# Patient Record
Sex: Female | Born: 1965 | Race: White | Hispanic: No | Smoking: Former smoker
Health system: Southern US, Community
[De-identification: ages and names within clinical notes are randomized; demographics above are authoritative.]

## PROBLEM LIST (undated history)

## (undated) DIAGNOSIS — B977 Papillomavirus as the cause of diseases classified elsewhere: Secondary | ICD-10-CM

## (undated) DIAGNOSIS — IMO0002 Reserved for concepts with insufficient information to code with codable children: Secondary | ICD-10-CM

## (undated) DIAGNOSIS — IMO0001 Reserved for inherently not codable concepts without codable children: Secondary | ICD-10-CM

## (undated) DIAGNOSIS — R12 Heartburn: Secondary | ICD-10-CM

## (undated) DIAGNOSIS — R896 Abnormal cytological findings in specimens from other organs, systems and tissues: Secondary | ICD-10-CM

## (undated) DIAGNOSIS — N200 Calculus of kidney: Secondary | ICD-10-CM

## (undated) HISTORY — DX: Calculus of kidney: N20.0

## (undated) HISTORY — DX: Heartburn: R12

## (undated) HISTORY — DX: Papillomavirus as the cause of diseases classified elsewhere: B97.7

## (undated) HISTORY — DX: Reserved for inherently not codable concepts without codable children: IMO0001

## (undated) HISTORY — PX: MOUTH SURGERY: SHX715

## (undated) HISTORY — DX: Reserved for concepts with insufficient information to code with codable children: IMO0002

## (undated) HISTORY — PX: KIDNEY SURGERY: SHX687

## (undated) HISTORY — PX: GYNECOLOGIC CRYOSURGERY: SHX857

## (undated) HISTORY — PX: GANGLION CYST EXCISION: SHX1691

## (undated) HISTORY — DX: Abnormal cytological findings in specimens from other organs, systems and tissues: R89.6

## (undated) HISTORY — PX: KNEE SURGERY: SHX244

---

## 1997-07-26 ENCOUNTER — Encounter: Admission: RE | Admit: 1997-07-26 | Discharge: 1997-10-24 | Payer: Self-pay | Admitting: Gynecology

## 1997-08-17 ENCOUNTER — Inpatient Hospital Stay (HOSPITAL_COMMUNITY): Admission: AD | Admit: 1997-08-17 | Discharge: 1997-08-17 | Payer: Self-pay | Admitting: Gynecology

## 1997-10-01 ENCOUNTER — Inpatient Hospital Stay (HOSPITAL_COMMUNITY): Admission: AD | Admit: 1997-10-01 | Discharge: 1997-10-04 | Payer: Self-pay | Admitting: Obstetrics and Gynecology

## 1997-11-01 ENCOUNTER — Encounter (HOSPITAL_COMMUNITY): Admission: RE | Admit: 1997-11-01 | Discharge: 1997-12-05 | Payer: Self-pay | Admitting: Gynecology

## 1998-11-19 ENCOUNTER — Other Ambulatory Visit: Admission: RE | Admit: 1998-11-19 | Discharge: 1998-11-19 | Payer: Self-pay | Admitting: Gynecology

## 1999-11-20 ENCOUNTER — Other Ambulatory Visit: Admission: RE | Admit: 1999-11-20 | Discharge: 1999-11-20 | Payer: Self-pay | Admitting: Gynecology

## 2000-12-18 ENCOUNTER — Other Ambulatory Visit: Admission: RE | Admit: 2000-12-18 | Discharge: 2000-12-18 | Payer: Self-pay | Admitting: Gynecology

## 2001-06-10 ENCOUNTER — Other Ambulatory Visit: Admission: RE | Admit: 2001-06-10 | Discharge: 2001-06-10 | Payer: Self-pay | Admitting: Gynecology

## 2001-12-31 ENCOUNTER — Other Ambulatory Visit: Admission: RE | Admit: 2001-12-31 | Discharge: 2001-12-31 | Payer: Self-pay | Admitting: Gynecology

## 2002-11-17 ENCOUNTER — Ambulatory Visit (HOSPITAL_BASED_OUTPATIENT_CLINIC_OR_DEPARTMENT_OTHER): Admission: RE | Admit: 2002-11-17 | Discharge: 2002-11-17 | Payer: Self-pay | Admitting: Orthopedic Surgery

## 2003-01-10 ENCOUNTER — Other Ambulatory Visit: Admission: RE | Admit: 2003-01-10 | Discharge: 2003-01-10 | Payer: Self-pay | Admitting: Gynecology

## 2004-01-12 ENCOUNTER — Other Ambulatory Visit: Admission: RE | Admit: 2004-01-12 | Discharge: 2004-01-12 | Payer: Self-pay | Admitting: Gynecology

## 2005-01-20 ENCOUNTER — Other Ambulatory Visit: Admission: RE | Admit: 2005-01-20 | Discharge: 2005-01-20 | Payer: Self-pay | Admitting: Gynecology

## 2005-04-30 ENCOUNTER — Emergency Department (HOSPITAL_COMMUNITY): Admission: EM | Admit: 2005-04-30 | Discharge: 2005-05-01 | Payer: Self-pay | Admitting: Emergency Medicine

## 2005-06-05 ENCOUNTER — Encounter: Admission: RE | Admit: 2005-06-05 | Discharge: 2005-06-05 | Payer: Self-pay | Admitting: Gastroenterology

## 2006-01-22 ENCOUNTER — Other Ambulatory Visit: Admission: RE | Admit: 2006-01-22 | Discharge: 2006-01-22 | Payer: Self-pay | Admitting: Gynecology

## 2006-09-23 ENCOUNTER — Encounter: Admission: RE | Admit: 2006-09-23 | Discharge: 2006-09-23 | Payer: Self-pay | Admitting: Family Medicine

## 2007-02-18 ENCOUNTER — Other Ambulatory Visit: Admission: RE | Admit: 2007-02-18 | Discharge: 2007-02-18 | Payer: Self-pay | Admitting: Gynecology

## 2007-07-28 ENCOUNTER — Other Ambulatory Visit: Admission: RE | Admit: 2007-07-28 | Discharge: 2007-07-28 | Payer: Self-pay | Admitting: Gynecology

## 2007-09-09 IMAGING — US US TRANSVAGINAL NON-OB
1 series · 14 of 25 positions shown · non-contrast
Comparison: none

CLINICAL DATA: Severe abdominal pain.  
 TRANSABDOMINAL AND TRANSVAGINAL PELVIC ULTRASOUND:
TECHNIQUE: Both transabdominal and transvaginal ultrasound examinations of the pelvis were performed including evaluation of the uterus, ovaries, adnexal regions, and pelvic cul-de-sac.

[Series 1: unknown · 0.26mm/px · 14 of 43 slices shown]
[im 1/43]
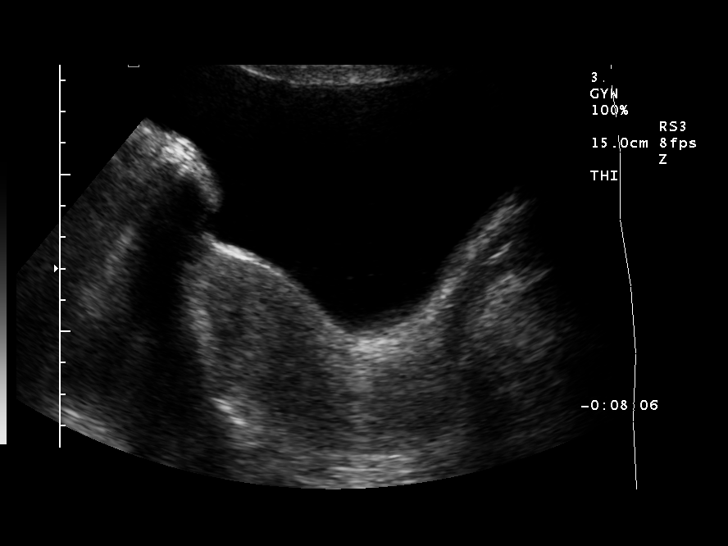
[im 4/43]
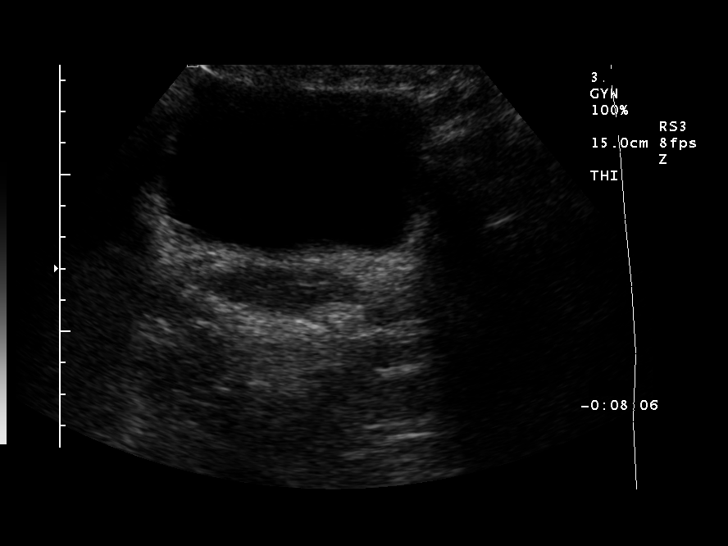
[im 8/43]
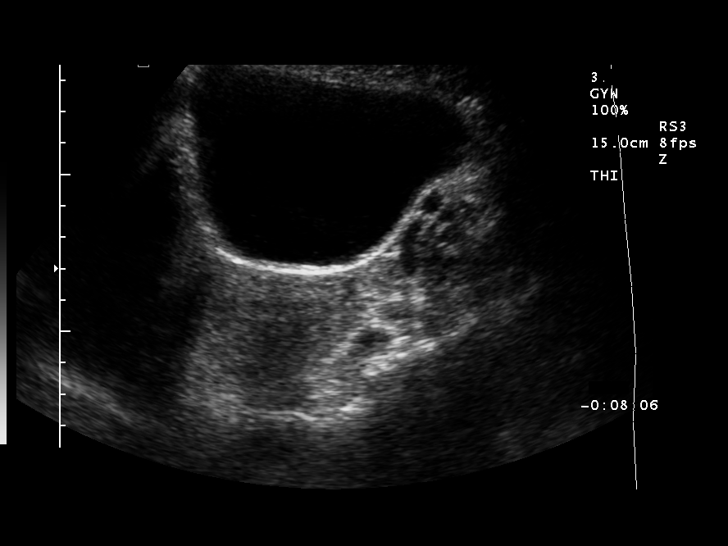
[im 11/43]
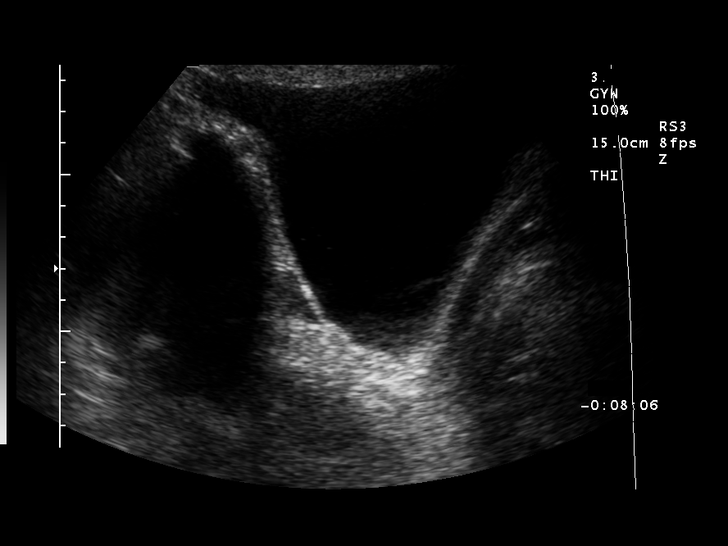
[im 15/43]
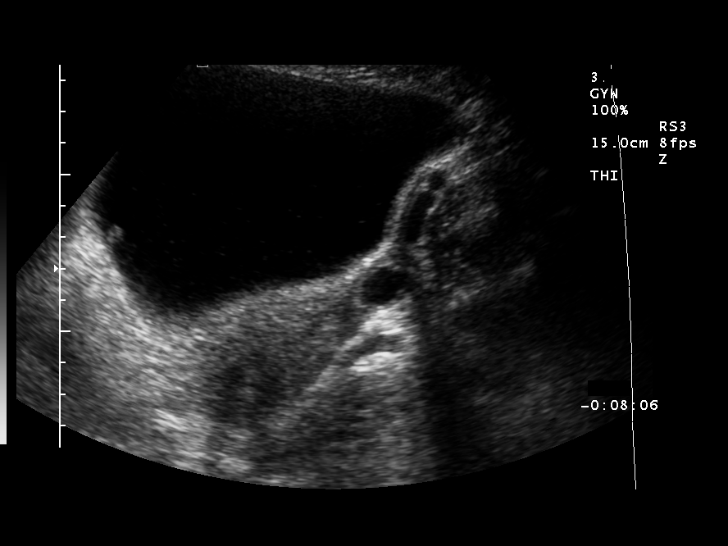
[im 16/43]
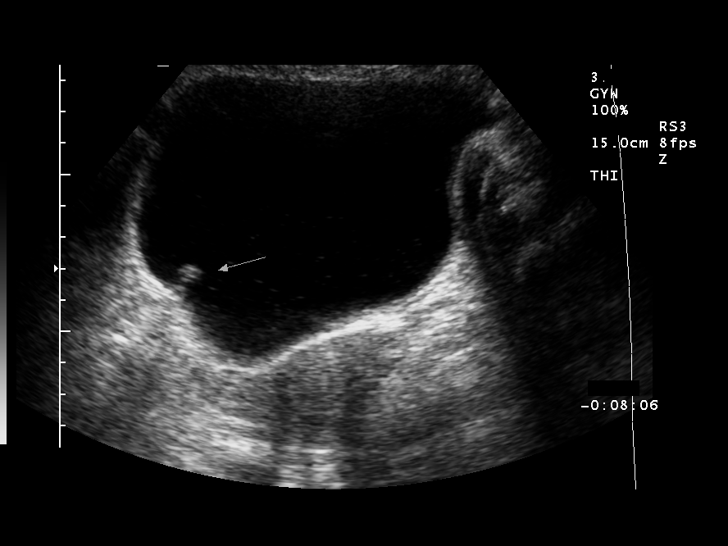
[im 20/43]
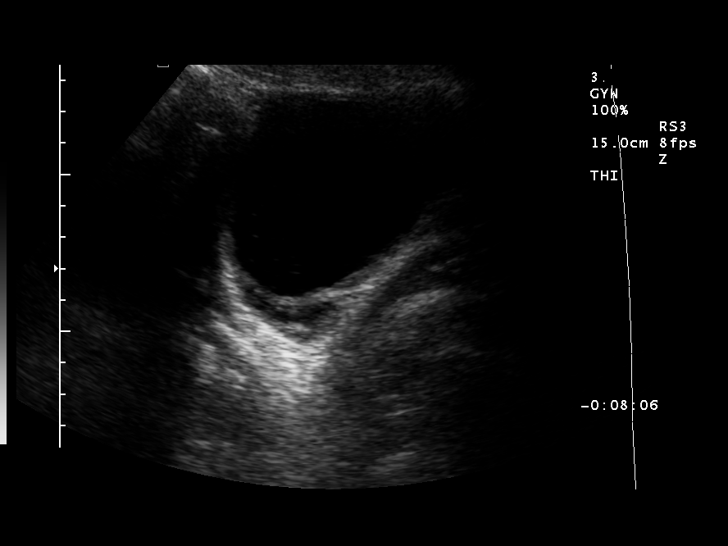
[im 23/43]
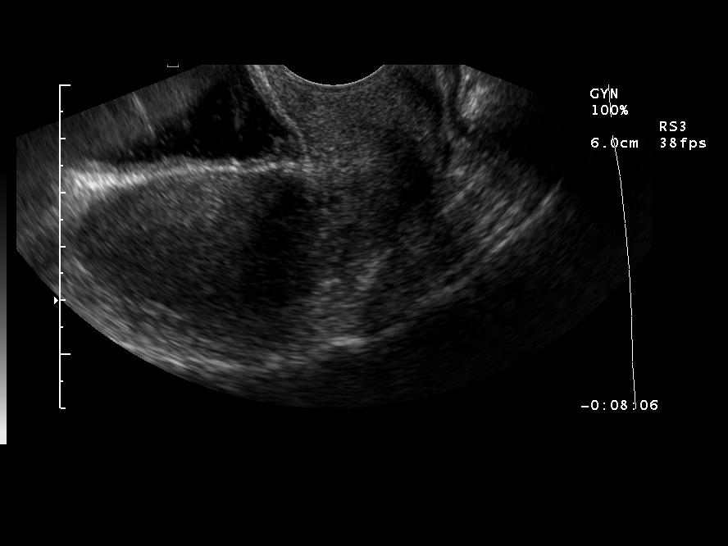
[im 27/43]
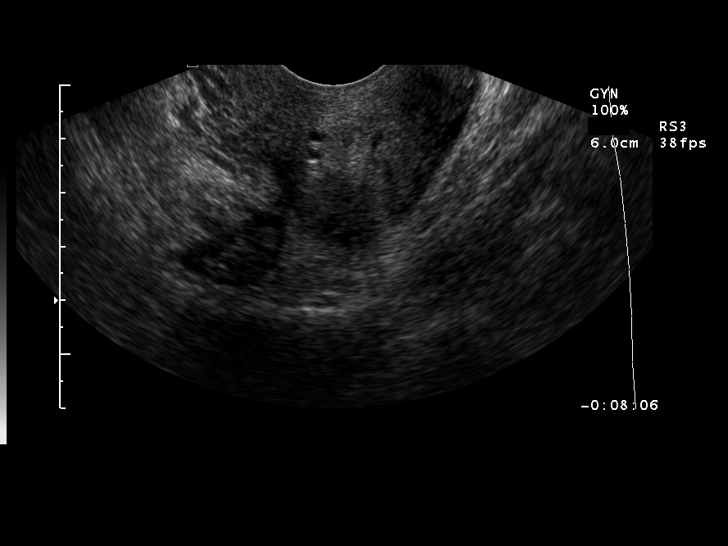
[im 29/43]
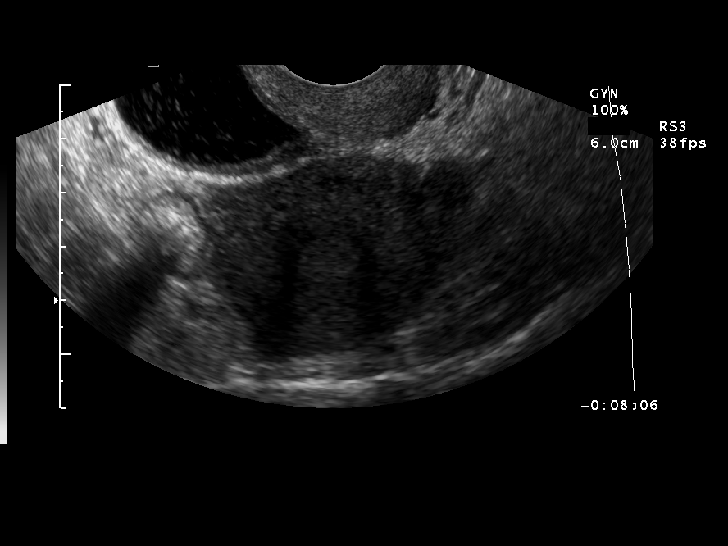
[im 32/43]
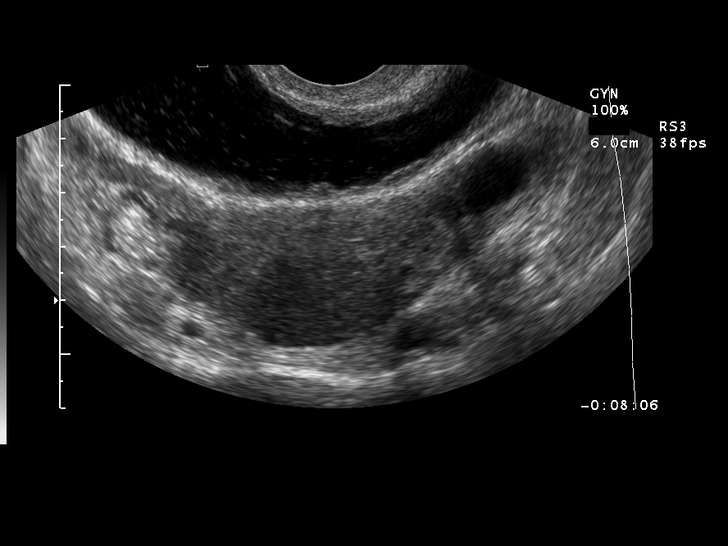
[im 36/43]
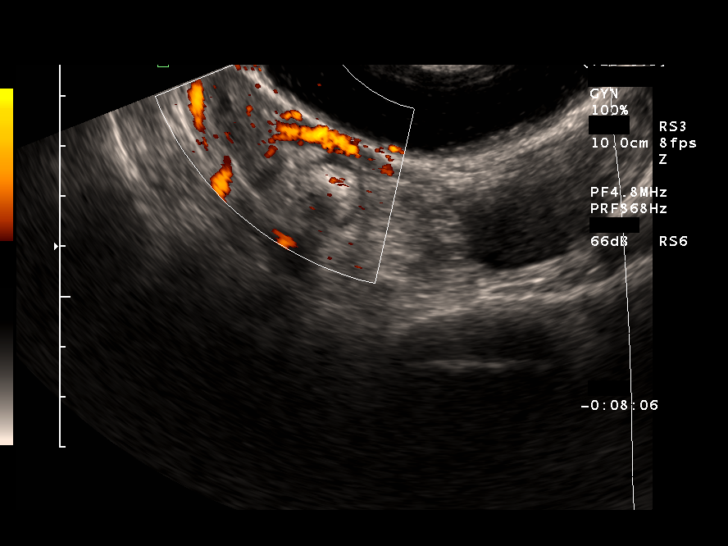
[im 39/43]
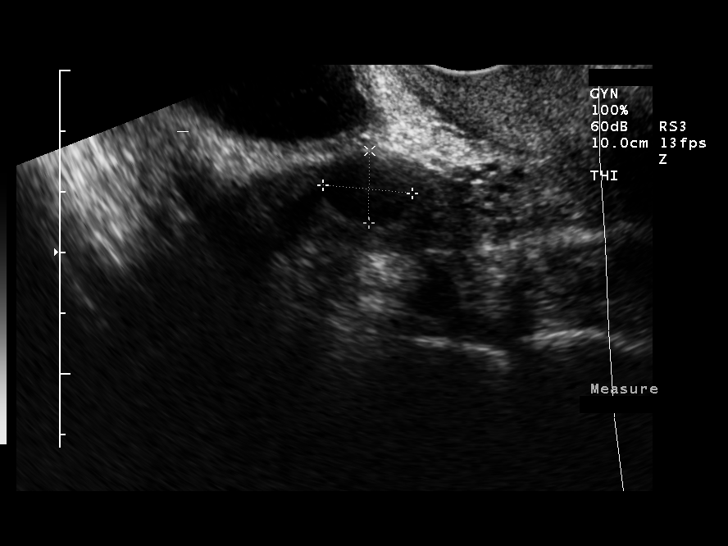
[im 43/43]
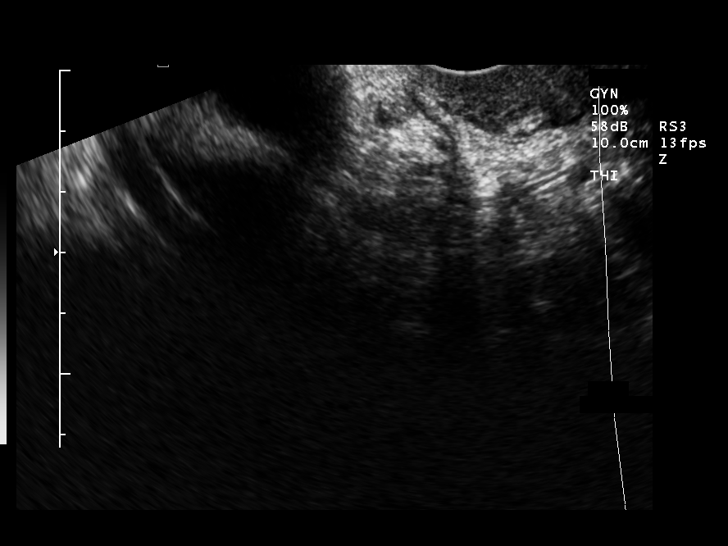

[14 of 25 positions shown; findings below may reference images not displayed]

FINDINGS: The uterus measures 7.6 x 5.5 x 3.4 cm.  Endometrial stripe measures 0.8 cm.  Right ovary measures 4.1 x 1.6 x 1.7 cm and the left ovary measures 3.5 x 1.6 x 1.9 cm.  Ovaries appear normal with a small follicle identified on the left.  No free pelvic fluid.  Limited visualization of the bladder demonstrates a small echogenic focus posteriorly.  Patient may have history of prior ureteral reimplantation.
IMPRESSION: 1.  No acute finding in the pelvis.  
 2.  Small echogenic focus in the posterior bladder which may be secondary to ureteral reimplantation.  Please correlate with clinical history.

## 2007-10-14 IMAGING — US US ABDOMEN COMPLETE
1 series · 14 of 25 positions shown · non-contrast
Comparison: none

CLINICAL DATA: Periumbilical and left lower quadrant abdominal pain.

[Series 1: unknown · 14 of 77 slices shown]
[im 1/77]
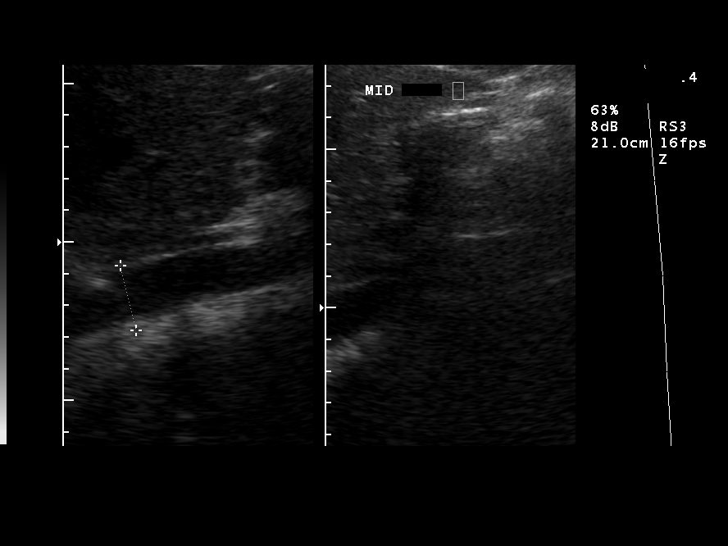
[im 7/77]
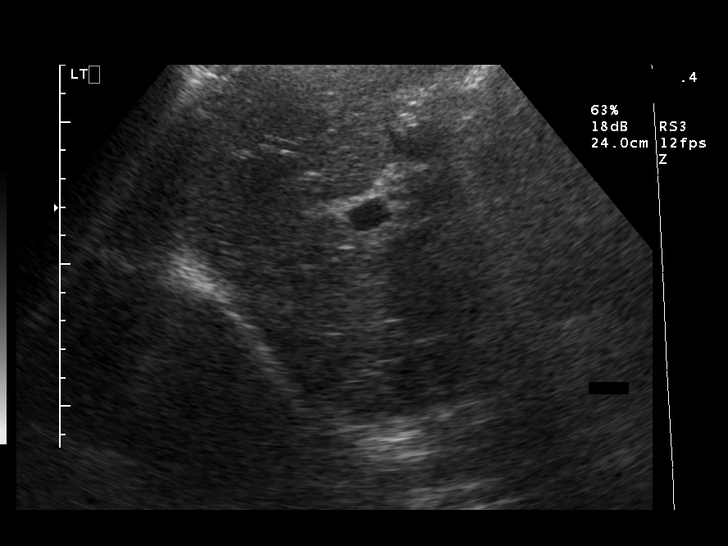
[im 13/77]
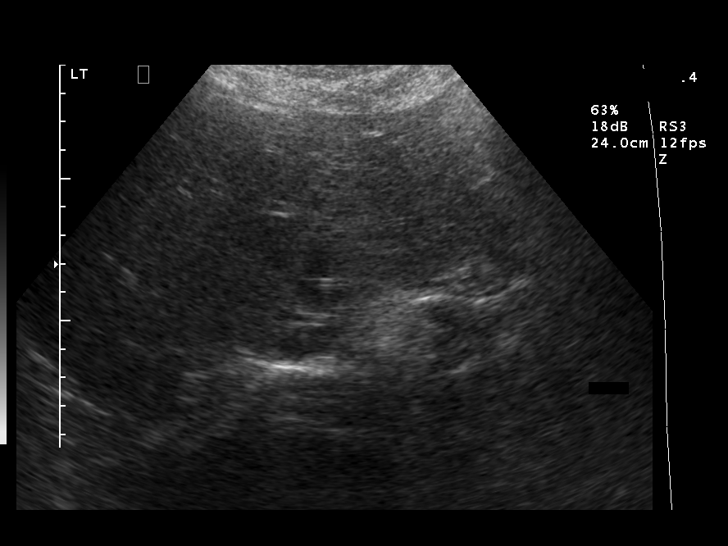
[im 20/77]
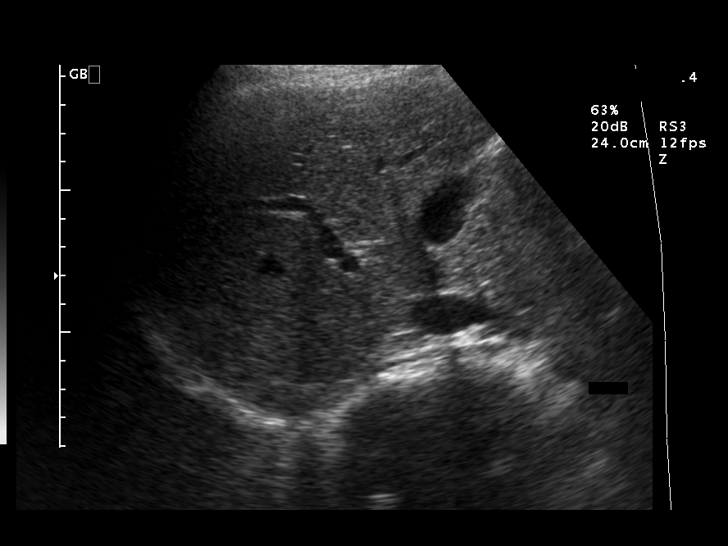
[im 26/77]
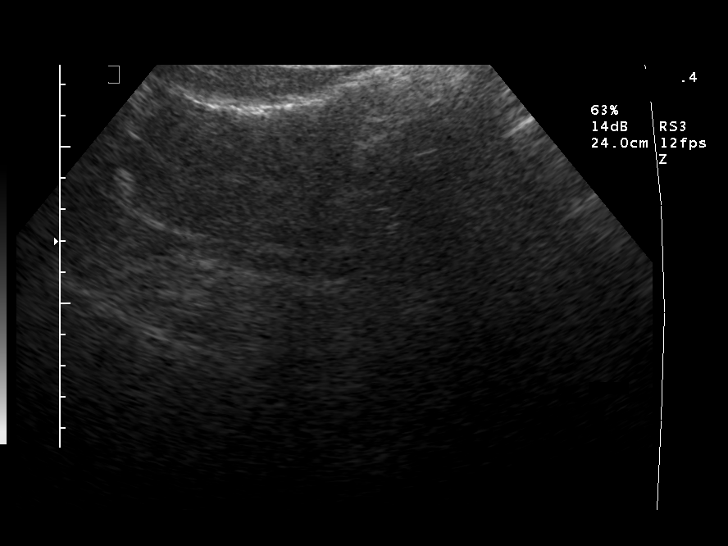
[im 29/77]
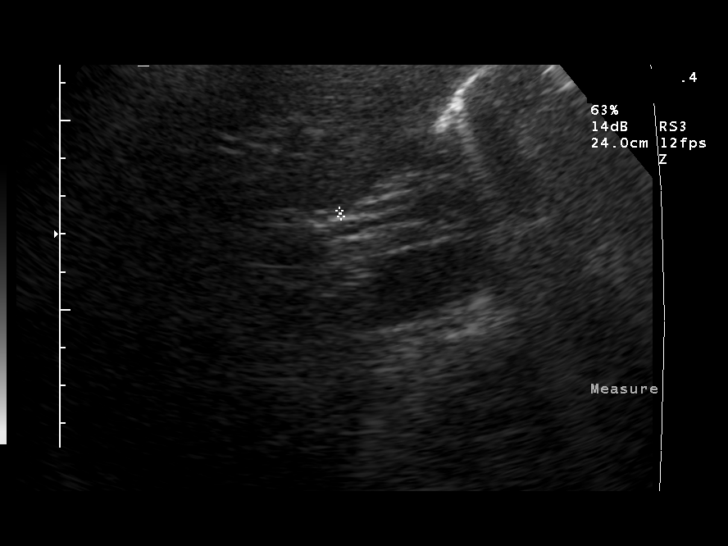
[im 35/77]
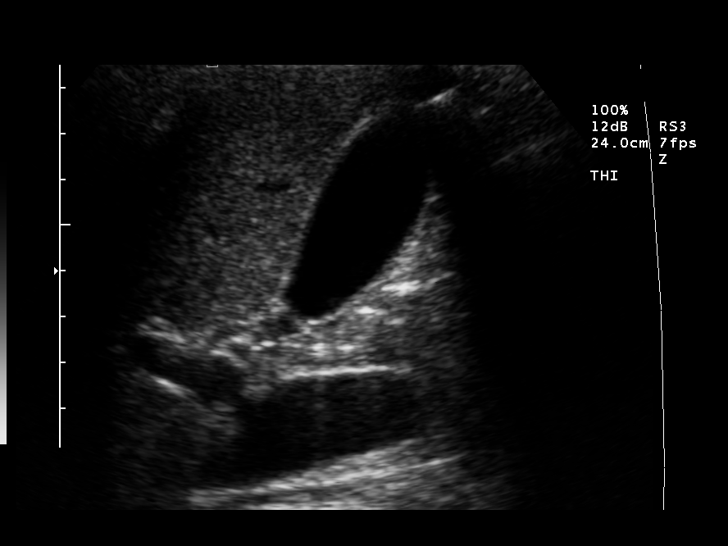
[im 42/77]
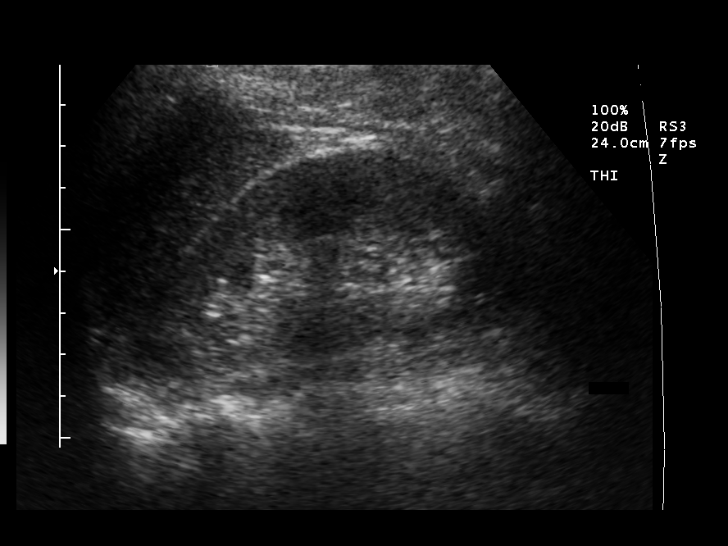
[im 48/77]
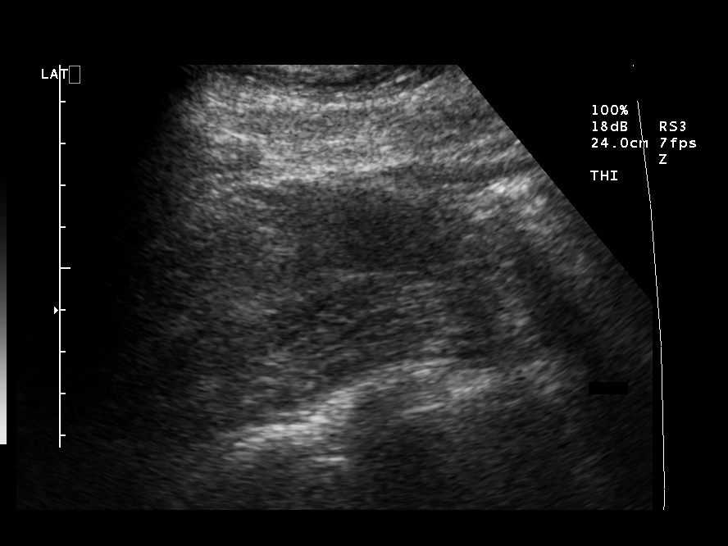
[im 51/77]
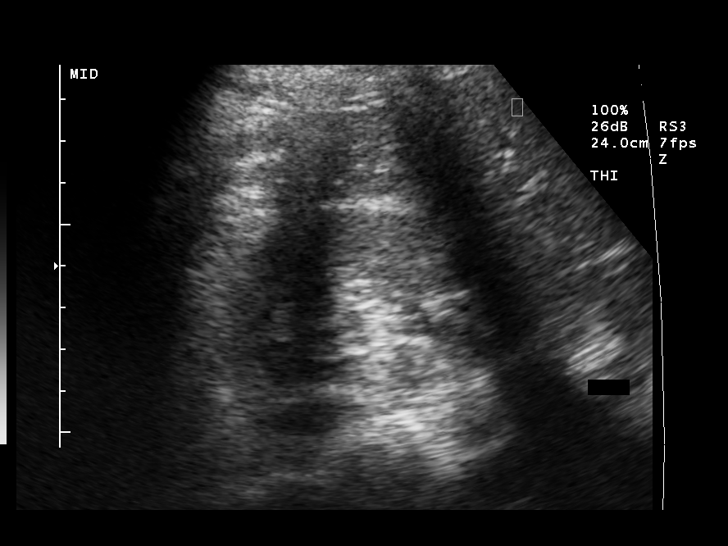
[im 58/77]
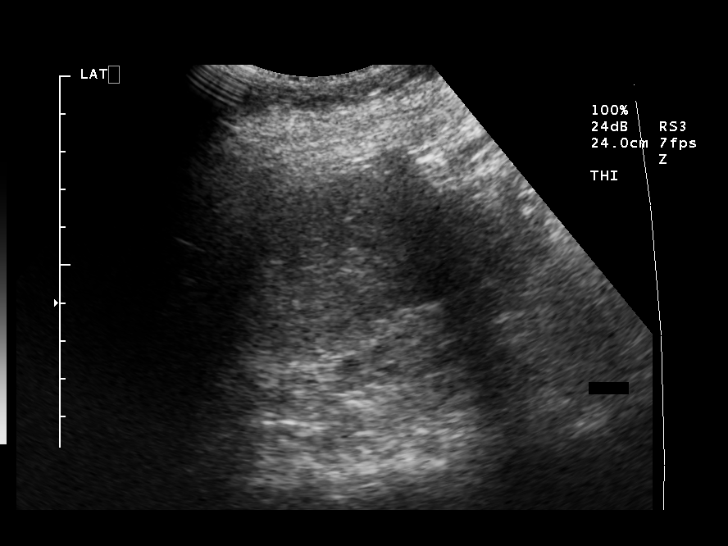
[im 64/77]
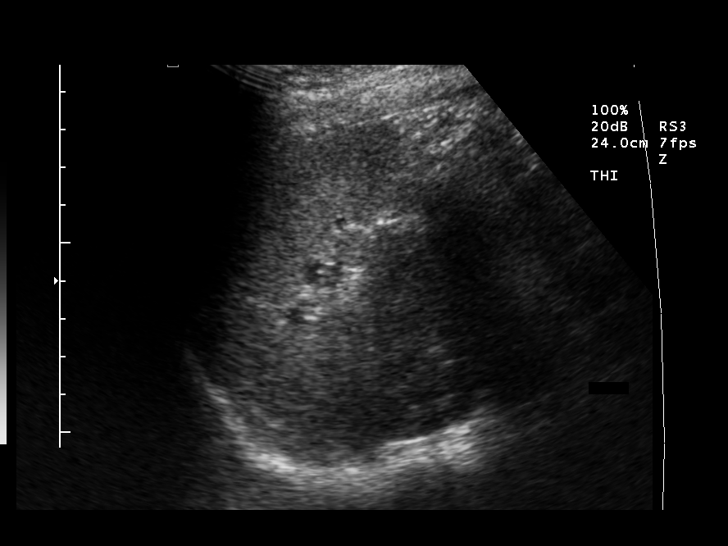
[im 70/77]
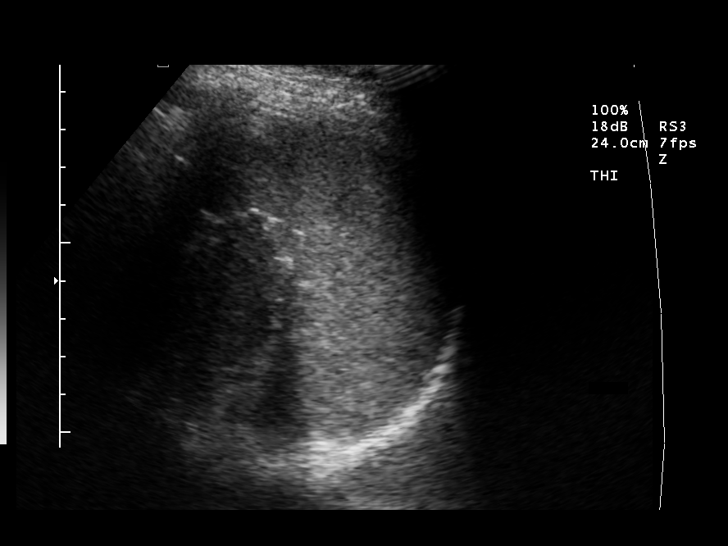
[im 77/77]
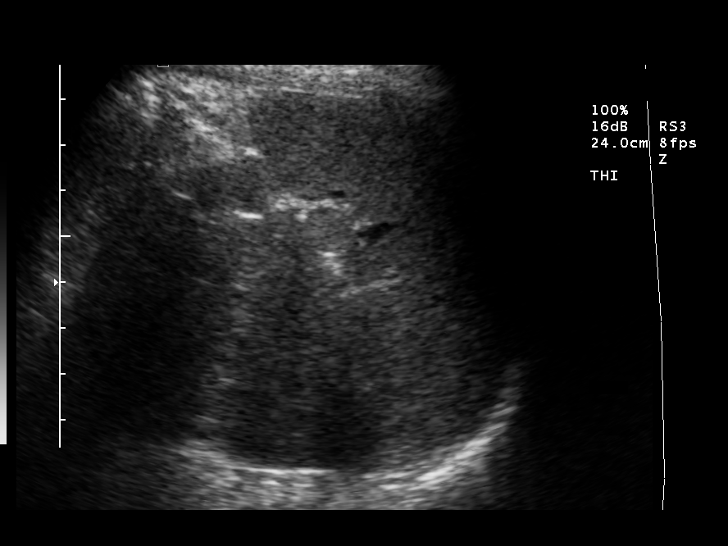

[14 of 25 positions shown; findings below may reference images not displayed]

COMPLETE ABDOMEN ULTRASOUND

The gallbladder, liver, spleen and kidneys have normal appearances. The pancreas
was obscured by overlying bowel gas, as was the distal inferior vena cava and
portions of the abdominal aorta. The visualized portions of the aorta and
inferior vena cava have normal appearances. No gallstones, biliary ductal
dilatation or free peritoneal fluid.  The common duct measures 1.9 mm in maximum
diameter proximally.

IMPRESSION

Obscuration of the pancreas and portions of the abdominal aorta and inferior
vena cava by overlying bowel gas. Otherwise, normal examination.

## 2007-10-14 IMAGING — RF DG UGI W/ SMALL BOWEL HIGH DENSITY
13 of 23 series · 13 of 23 positions shown · non-contrast
Comparison: none

<!--  IDXRADR:ADDEND:BEGIN -->Addendum Begins
<!--  IDXRADR:ADDEND:INNER_BEGIN -->Addendum: The first line on the findings section should read "The preliminary
supine radiograph of the abdomen..."
<!--  IDXRADR:ADDEND:INNER_END -->Addendum Ends
<!--  IDXRADR:ADDEND:END -->Clinical Data:  Unexplained episodes of periumbilical and left lower quadrant
abdominal pain.

UPPER GI SERIES W/ HIGH-DENSITY BARIUM AND SMALL BOWEL FOLLOW-THROUGH:
TECHNIQUE: After obtaining a scout radiograph, a double-contrast upper GI
series was performed using both high-density and thin barium.  Serial small
bowel radiographs were then obtained, including spot views of the terminal
ileum.

[Series 1: run · 1 of 1 slices shown (1 of 13)]
[im 1/1]
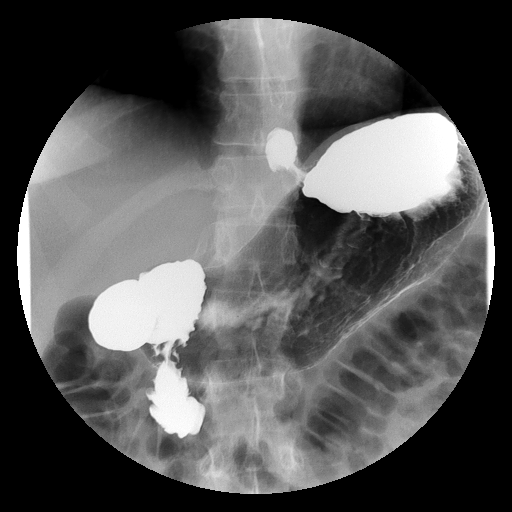

[Series 3: run · 1 of 1 slices shown (2 of 13)]
[im 1/1]
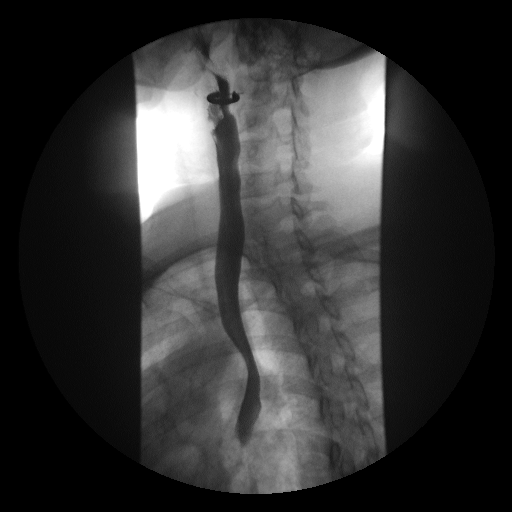

[Series 5: run · 1 of 1 slices shown (3 of 13)]
[im 1/1]
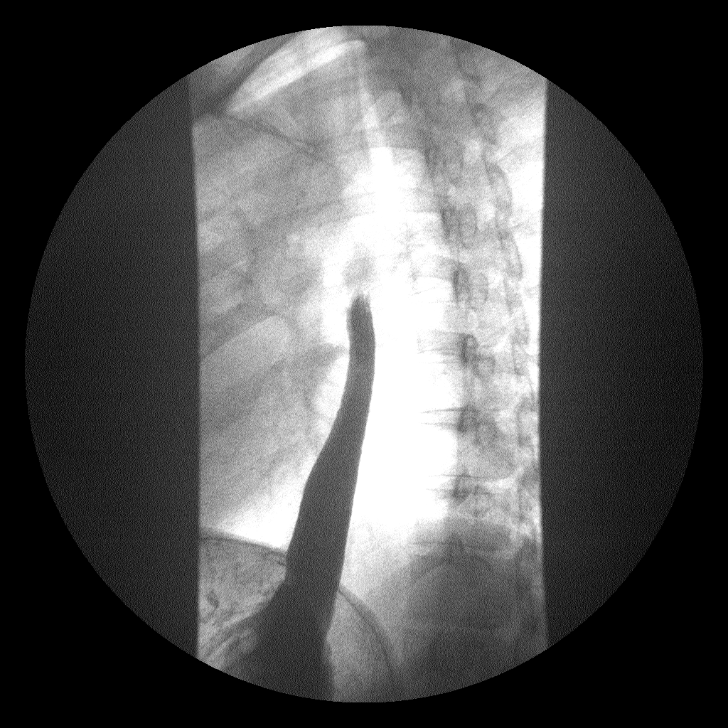

[Series 7: run · 1 of 1 slices shown (4 of 13)]
[im 1/1]
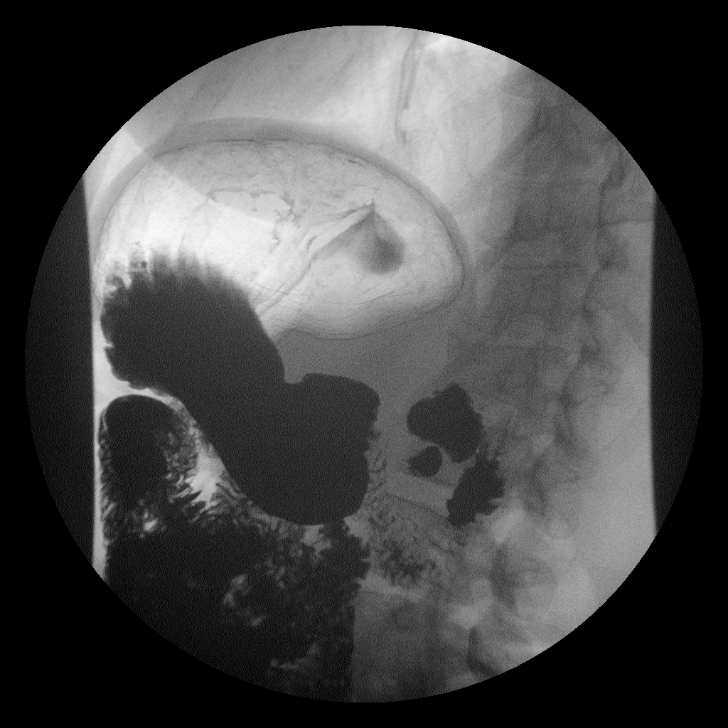

[Series 8: run · 1 of 1 slices shown (5 of 13)]
[im 1/1]
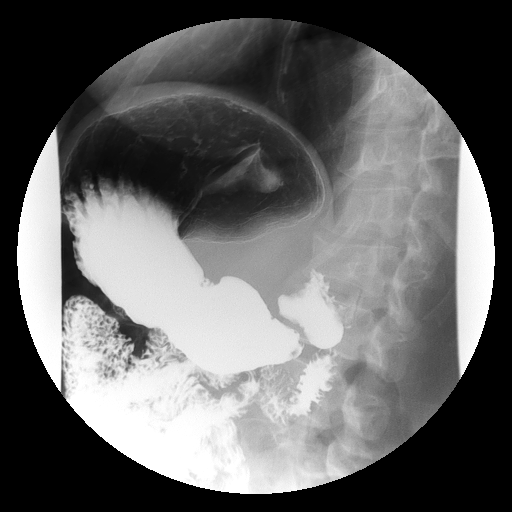

[Series 10: run · 1 of 1 slices shown (6 of 13)]
[im 1/1]
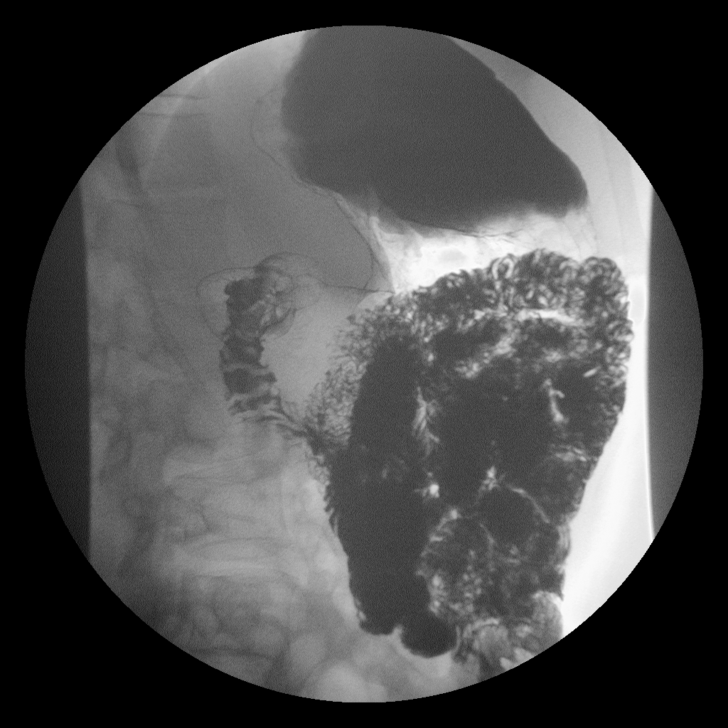

[Series 12: run · 1 of 1 slices shown (7 of 13)]
[im 1/1]
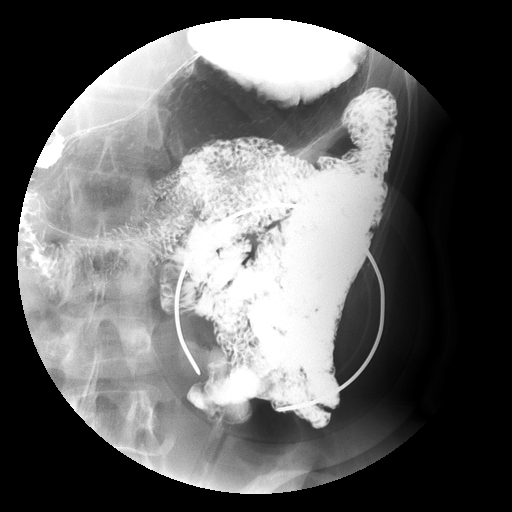

[Series 14: run · 1 of 1 slices shown (8 of 13)]
[im 1/1]
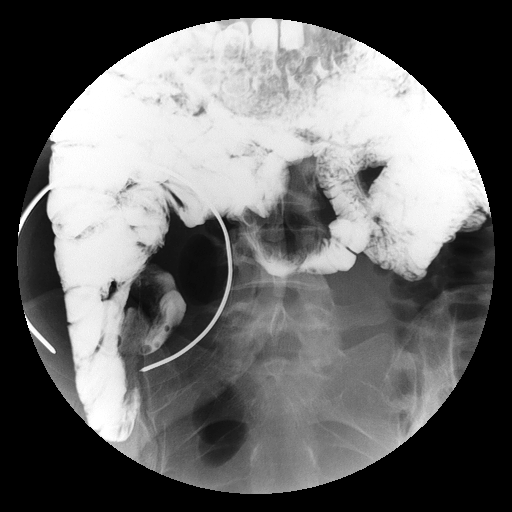

[Series 16: run · 1 of 1 slices shown (9 of 13)]
[im 1/1]
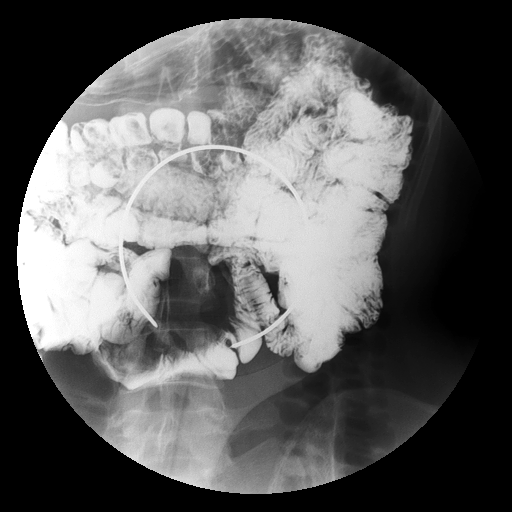

[Series 17: run · 1 of 1 slices shown (10 of 13)]
[im 1/1]
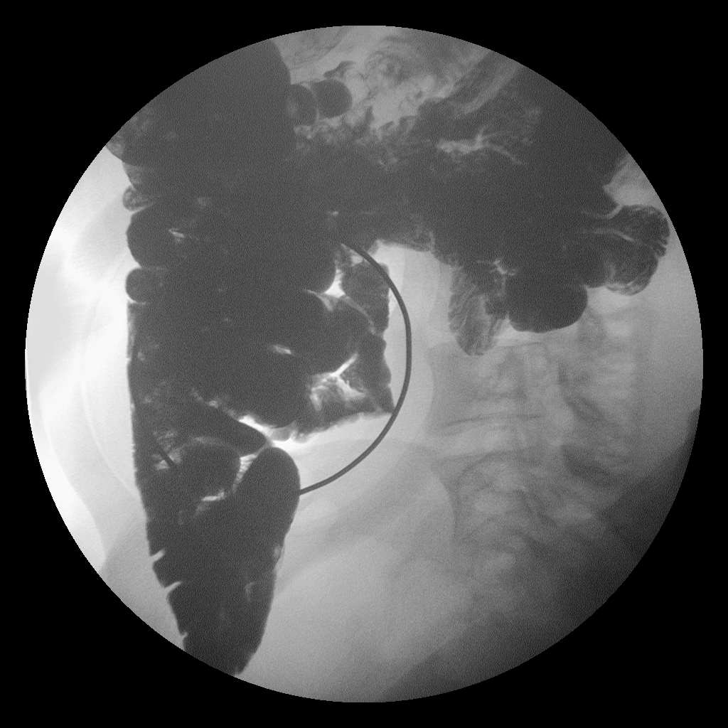

[Series 19: run · 1 of 1 slices shown (11 of 13)]
[im 1/1]
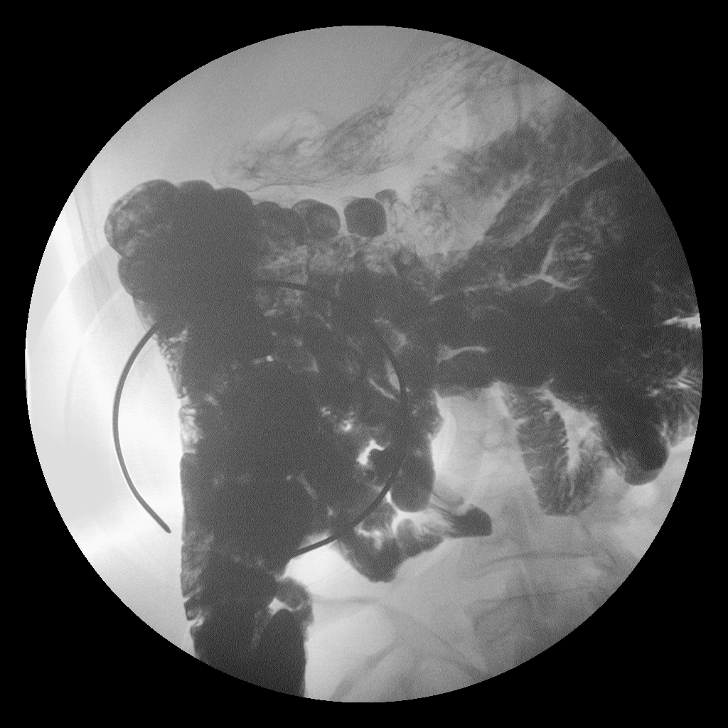

[Series 21: run · 1 of 1 slices shown (12 of 13)]
[im 1/1]
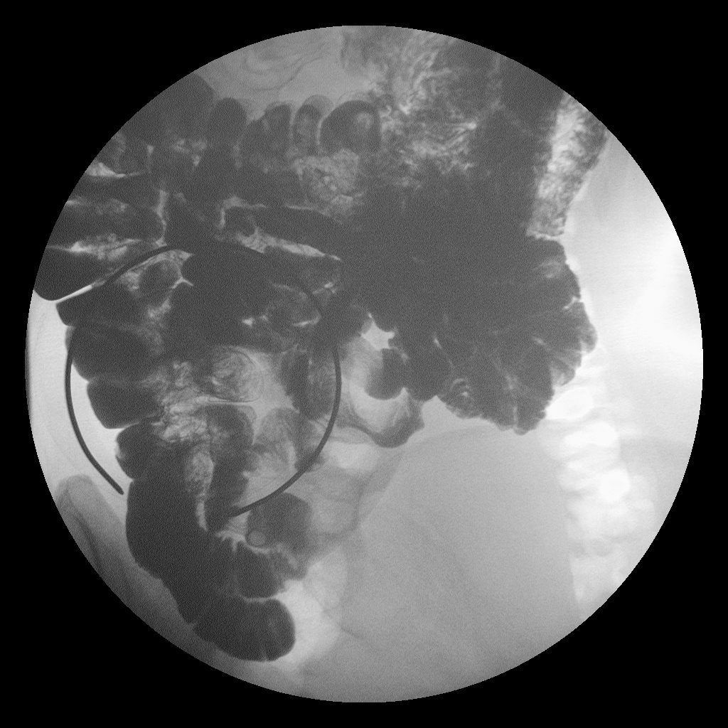

[Series 23: run · 1 of 1 slices shown (13 of 13)]
[im 1/1]
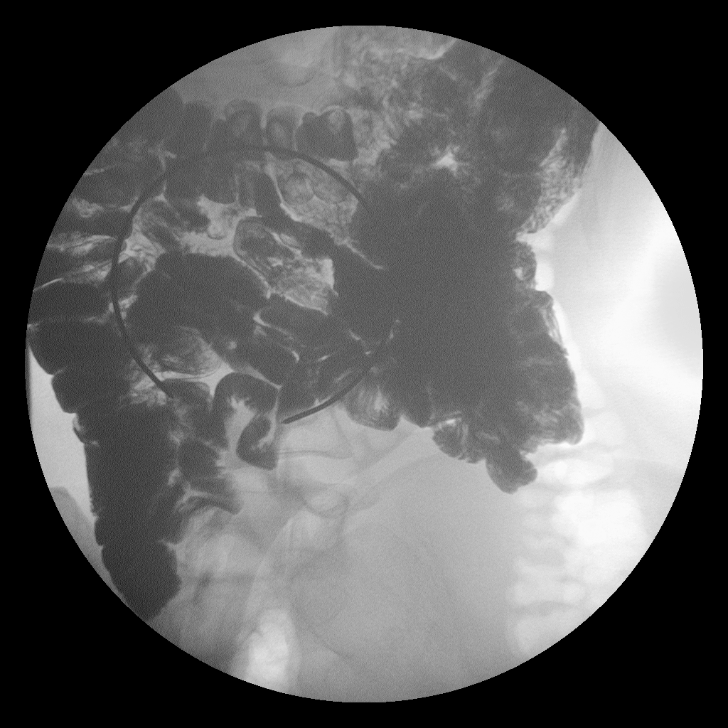

[13 of 23 positions shown; findings below may reference images not displayed]

FINDINGS: The preliminary supine ray graft of the abdomen demonstrates a normal
bowel gas pattern and pelvic soft tissue density compatible with a full urinary
bladder. Minimal dextro-convex thoracolumbar scoliosis.

The patient swallowed barium without difficulty. Normal esophageal peristalsis
with no hypopharyngeal abnormalities. Normal appearing esophagus, stomach,
duodenal bulb and small bowel, including a well visualized terminal ileum. No
hiatal hernia, gastroesophageal reflux, strictures, masses or ulcerations seen.
No small bowel dilatation. The small bowel is displaced out of the pelvis
despite the patient emptying her bladder. She reports being told on a previous
ultrasound elsewhere that she had a huge urinary bladder. She also reports a
previous bladder surgical procedure as a child, to correct vesicoureteral
reflux. Review the pelvic ultrasound, dated 05/01/2005, demonstrates a distended
urinary bladder, normal appearing uterus and no pelvic mass.
IMPRESSION: 1. Normal upper GI and small bowel series. 
2. Distended urinary bladder with evidence of a large postvoid residual.

## 2008-03-10 ENCOUNTER — Other Ambulatory Visit: Admission: RE | Admit: 2008-03-10 | Discharge: 2008-03-10 | Payer: Self-pay | Admitting: Gynecology

## 2008-03-10 ENCOUNTER — Encounter: Payer: Self-pay | Admitting: Gynecology

## 2008-03-10 ENCOUNTER — Ambulatory Visit: Payer: Self-pay | Admitting: Gynecology

## 2009-03-12 ENCOUNTER — Encounter: Payer: Self-pay | Admitting: Gynecology

## 2009-03-12 ENCOUNTER — Other Ambulatory Visit: Admission: RE | Admit: 2009-03-12 | Discharge: 2009-03-12 | Payer: Self-pay | Admitting: Gynecology

## 2009-03-12 ENCOUNTER — Ambulatory Visit: Payer: Self-pay | Admitting: Gynecology

## 2010-03-19 ENCOUNTER — Ambulatory Visit: Payer: Self-pay | Admitting: Gynecology

## 2010-03-19 ENCOUNTER — Other Ambulatory Visit: Admission: RE | Admit: 2010-03-19 | Discharge: 2010-03-19 | Payer: Self-pay | Admitting: Gynecology

## 2010-09-13 NOTE — Op Note (Signed)
Cheryl Johnson, Cheryl Johnson NO.:  [id number]   MEDICAL RECORD NO.:  [id number]                   PATIENT TYPE:  AMB   LOCATION:  [facility]                                  FACILITY:  [facility]   PHYSICIAN:  Loreta Ave, M.D.              DATE OF BIRTH:  11/27/1965   DATE OF PROCEDURE:  11/17/2002  DATE OF DISCHARGE:                                 OPERATIVE REPORT   PREOPERATIVE DIAGNOSIS:  Cystic versus solid mass, long finger, right hand  radial aspect overlying middle and proximal phalanx.   POSTOPERATIVE DIAGNOSIS:  Cystic versus solid mass, long finger, right hand  radial aspect overlying middle and proximal phalanx, with mass being a  ganglion emanating from the PIP joint.   PROCEDURE:  Excision of ganglion, long finger, right hand.   SURGEON:  Loreta Ave, M.D.   ASSISTANT:  Arlys John D. Petrarca, P.A.-C.   ANESTHESIA:  General.   ESTIMATED BLOOD LOSS:  Minimal.   TOURNIQUET TIME:  139 minutes.   SPECIMENS:  None.   CULTURES:  None.   COMPLICATIONS:  None.   DRESSINGS:  Soft compressive with a dressing over the finger.  Also, a  digital nerve block for postop analgesia.   PROCEDURE:  The patient was brought to the operating room and after adequate  anesthesia had been obtained, a tourniquet was applied to the upper aspect  of the right arm.  Prepped and draped in the usual sterile fashion avoiding  iodine because of allergy.  Exsanguinated with elevation of Esmarch,  tourniquet deflated to 250 mmHg.  The mass, which is overlying the proximal  phalanx PIP joint of the long finger right hand on the radial side was  approached with a longitudinal incision avoiding crossing the joint.  The  skin and subcutaneous tissue divided.  The mass immediately evident and was  an obvious ganglion 2 cm long and about 1 cm from front to back.  Excised in  its entirety and tracked all the way down to the margin of the PIP joint at  the volar aspect of  the collateral ligament.  A small window was made where  the ganglion was coming from.  The joint inspected, no other findings.  Digital artery protected throughout.  Extensor tendon remained intact as  well as collateral ligament.  The wound irrigated.  Skin was closed with nylon.  Digital block with Marcaine without  epinephrine.  Sterile compressive dressing applied.  Tourniquet deflated and  removed.  Anesthesia reversed.  Brought to the recovery room.  Tolerated the  surgery well without complications.                                               Loreta Ave, M.D.    DFM/MEDQ  D:  11/17/2002  T:  11/17/2002  Job:  [id number]

## 2011-03-13 DIAGNOSIS — IMO0001 Reserved for inherently not codable concepts without codable children: Secondary | ICD-10-CM | POA: Insufficient documentation

## 2011-03-13 DIAGNOSIS — N200 Calculus of kidney: Secondary | ICD-10-CM | POA: Insufficient documentation

## 2011-03-17 ENCOUNTER — Encounter: Payer: Self-pay | Admitting: Gynecology

## 2011-03-24 ENCOUNTER — Encounter: Payer: Self-pay | Admitting: Gynecology

## 2011-03-28 ENCOUNTER — Ambulatory Visit (INDEPENDENT_AMBULATORY_CARE_PROVIDER_SITE_OTHER): Payer: BC Managed Care – PPO | Admitting: Gynecology

## 2011-03-28 ENCOUNTER — Encounter: Payer: Self-pay | Admitting: Gynecology

## 2011-03-28 ENCOUNTER — Other Ambulatory Visit (HOSPITAL_COMMUNITY)
Admission: RE | Admit: 2011-03-28 | Discharge: 2011-03-28 | Disposition: A | Discharge status: Home / Self Care | Payer: BC Managed Care – PPO | Source: Ambulatory Visit | Attending: Gynecology | Admitting: Gynecology

## 2011-03-28 VITALS — BP 124/74 | Ht 64.0 in | Wt 148.0 lb

## 2011-03-28 DIAGNOSIS — Z01419 Encounter for gynecological examination (general) (routine) without abnormal findings: Secondary | ICD-10-CM | POA: Insufficient documentation

## 2011-03-28 DIAGNOSIS — R82998 Other abnormal findings in urine: Secondary | ICD-10-CM

## 2011-03-28 DIAGNOSIS — F419 Anxiety disorder, unspecified: Secondary | ICD-10-CM

## 2011-03-28 DIAGNOSIS — Z23 Encounter for immunization: Secondary | ICD-10-CM

## 2011-03-28 DIAGNOSIS — F411 Generalized anxiety disorder: Secondary | ICD-10-CM

## 2011-03-28 DIAGNOSIS — Z131 Encounter for screening for diabetes mellitus: Secondary | ICD-10-CM

## 2011-03-28 DIAGNOSIS — Z13220 Encounter for screening for lipoid disorders: Secondary | ICD-10-CM

## 2011-03-28 MED ORDER — NORETHINDRONE ACET-ETHINYL EST 1.5-30 MG-MCG PO TABS: 1.0000 | ORAL_TABLET | Freq: Every day | ORAL | Status: DC

## 2011-03-28 MED ORDER — ALPRAZOLAM 0.5 MG PO TABS: 0.5000 mg | ORAL_TABLET | Freq: Every evening | ORAL | Status: AC | PRN

## 2011-03-28 NOTE — Progress Notes (Signed)
Addended by: Cammie Mcgee T on: 03/28/2011 05:02 PM   Modules accepted: Orders

## 2011-03-28 NOTE — Progress Notes (Signed)
Cheryl Johnson Sep 01, 1965 [id number]        45 y.o.  for annual exam.  Doing well on oral contraceptives.  Past medical history,surgical history, medications, allergies, family history and social history were all reviewed and documented in the EPIC chart. ROS:  Was performed and pertinent positives and negatives are included in the history.  Exam: chaperone present Filed Vitals:   03/28/11 1458  BP: 124/74   General appearance  Normal Skin grossly normal Head/Neck normal with no cervical or supraclavicular adenopathy thyroid normal Lungs  clear Cardiac RR, without RMG Abdominal  soft, nontender, without masses, organomegaly or hernia Breasts  examined lying and sitting without masses, retractions, discharge or axillary adenopathy. Pelvic  Ext/BUS/vagina  normal   Cervix  normal  Pap done  Uterus  anteverted, normal size, shape and contour, midline and mobile nontender   Adnexa  Without masses or tenderness    Anus and perineum  normal   Rectovaginal  normal sphincter tone without palpated masses or tenderness.    Assessment/Plan:  45 y.o. female for annual exam.    1. Oral contraceptives. Patient doing well on birth control pills. I reviewed the risks include stroke heart attack DVT. She is being followed for any medical issues does not smoke and accepts risks I refilled her times a year. 2. Anxiety. Patient does have some anxiety I refilled her Xanax 0.5 mg #30 one by mouth every 6 hours when necessary anxiety with 1 refill. She is also on fluoxetine 20 mg daily but does not require a refill at this point and we will refill her when her prescription expires. 3. Breast health. SBE monthly reviewed. She had her mammogram 2 weeks ago we'll continue with annual mammography. 4. Pap smears. Patient does have history of some ascus Pap smears back in 2008 and 9 with negative high-risk HPV and a colposcopic directed biopsy showing cervicitis. Her most recent Pap smears have been normal. I  did a Pap smear today. I discussed with her that we may decide to less frequent screening protocol per current guidelines in the future and we'll rediscuss this next year. 5. Health maintenance. Baseline CBC glucose lipid profile urinalysis was ordered. Assuming she continues well from a gynecologic standpoint she'll see [place] in a year sooner as needed.    Dara Lords MD, 4:21 PM 03/28/2011

## 2011-03-28 NOTE — Patient Instructions (Signed)
Follow up in one year for annual exam 

## 2011-03-31 MED ORDER — SULFAMETHOXAZOLE-TMP DS 800-160 MG PO TABS: 1.0000 | ORAL_TABLET | Freq: Two times a day (BID) | ORAL | Status: AC

## 2011-03-31 NOTE — Progress Notes (Signed)
Addended by: Dara Lords on: 03/31/2011 10:19 AM   Modules accepted: Orders

## 2011-10-26 ENCOUNTER — Other Ambulatory Visit: Payer: Self-pay | Admitting: Gynecology

## 2012-03-12 ENCOUNTER — Other Ambulatory Visit: Payer: Self-pay | Admitting: Gynecology

## 2012-03-28 DIAGNOSIS — IMO0002 Reserved for concepts with insufficient information to code with codable children: Secondary | ICD-10-CM

## 2012-03-28 HISTORY — DX: Reserved for concepts with insufficient information to code with codable children: IMO0002

## 2012-03-28 HISTORY — PX: COLPOSCOPY: SHX161

## 2012-04-02 ENCOUNTER — Encounter: Payer: Self-pay | Admitting: Gynecology

## 2012-04-02 ENCOUNTER — Other Ambulatory Visit (HOSPITAL_COMMUNITY)
Admission: RE | Admit: 2012-04-02 | Discharge: 2012-04-02 | Disposition: A | Discharge status: Home / Self Care | Payer: BC Managed Care – PPO | Source: Ambulatory Visit | Attending: Gynecology | Admitting: Gynecology

## 2012-04-02 ENCOUNTER — Ambulatory Visit (INDEPENDENT_AMBULATORY_CARE_PROVIDER_SITE_OTHER): Payer: BC Managed Care – PPO | Admitting: Gynecology

## 2012-04-02 VITALS — BP 110/74 | Ht 64.5 in | Wt 155.0 lb

## 2012-04-02 DIAGNOSIS — Z23 Encounter for immunization: Secondary | ICD-10-CM

## 2012-04-02 DIAGNOSIS — Z01419 Encounter for gynecological examination (general) (routine) without abnormal findings: Secondary | ICD-10-CM | POA: Insufficient documentation

## 2012-04-02 DIAGNOSIS — R87810 Cervical high risk human papillomavirus (HPV) DNA test positive: Secondary | ICD-10-CM | POA: Insufficient documentation

## 2012-04-02 DIAGNOSIS — Z13220 Encounter for screening for lipoid disorders: Secondary | ICD-10-CM

## 2012-04-02 DIAGNOSIS — B373 Candidiasis of vulva and vagina: Secondary | ICD-10-CM

## 2012-04-02 DIAGNOSIS — Z1151 Encounter for screening for human papillomavirus (HPV): Secondary | ICD-10-CM | POA: Insufficient documentation

## 2012-04-02 DIAGNOSIS — N898 Other specified noninflammatory disorders of vagina: Secondary | ICD-10-CM

## 2012-04-02 DIAGNOSIS — Z131 Encounter for screening for diabetes mellitus: Secondary | ICD-10-CM

## 2012-04-02 LAB — WET PREP FOR TRICH, YEAST, CLUE
Clue Cells Wet Prep HPF POC: NONE SEEN
Trich, Wet Prep: NONE SEEN

## 2012-04-02 MED ORDER — FLUCONAZOLE 150 MG PO TABS: 150.0000 mg | ORAL_TABLET | Freq: Once | ORAL | Status: DC

## 2012-04-02 MED ORDER — NORETHINDRONE ACET-ETHINYL EST 1.5-30 MG-MCG PO TABS: 1.0000 | ORAL_TABLET | Freq: Every day | ORAL | Status: DC

## 2012-04-02 MED ORDER — ALPRAZOLAM 0.25 MG PO TABS: 0.2500 mg | ORAL_TABLET | Freq: Every evening | ORAL | Status: DC | PRN

## 2012-04-02 NOTE — Patient Instructions (Signed)
Follow up for fasting lab work. You do have a history of elevated cholesterol is important to recheck a fasting lipid profile. Otherwise follow up in one year for annual exam.

## 2012-04-02 NOTE — Progress Notes (Signed)
Cheryl Johnson [birth year] [id number]        46 y.o.  [id number] for annual exam.    Past medical history,surgical history, medications, allergies, family history and social history were all reviewed and documented in the EPIC chart. ROS:  Was performed and pertinent positives and negatives are included in the history.  Exam: Cheryl Johnson assistant Filed Vitals:   04/02/12 1420  BP: 110/74  Height: 5' 4.5" (1.638 m)  Weight: 155 lb (70.308 kg)   General appearance  Normal Skin grossly normal Head/Neck normal with no cervical or supraclavicular adenopathy thyroid normal Lungs  clear Cardiac RR, without RMG Abdominal  soft, nontender, without masses, organomegaly or hernia Breasts  examined lying and sitting without masses, retractions, discharge or axillary adenopathy. Pelvic  Ext/BUS/vagina  normal with slight cottage cheese discharge  Cervix  normal   Uterus  anteverted, normal size, shape and contour, midline and mobile nontender   Adnexa  Without masses or tenderness    Anus and perineum  normal   Rectovaginal  normal sphincter tone without palpated masses or tenderness.    Assessment/Plan:  46 y.o. [name] female for annual exam.   1. Contraception. Patients on oral contraceptives. She had questions about ablation as her periods sometimes R. heavier one month then the other and she thought that this would guarantee that she would not have any periods. I reviewed success rates with ablation and no guarantees of amenorrhea and she's decided to continue on the pills. We again discussed the risks. Does have a history of smoking a number of years ago but has not smoked for a number of years. Possible increased risks of stroke heart attack DVT reviewed. Alternatives to include IUD sterilization progesterone only discussed. Patient's comfortable with the risk and I refilled her times a year. 2. Mammography done today. Continue with annual mammography. SBE monthly reviewed. 3. Pap smear  02/2011. Pap/HPV done today.  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 Follow up Pap smears have all been normal with the exception of ASCUS negative high risk HPV in 2009.  We'll plan every 3 year to 5 year screening assuming this Pap smear normal with HPV. 4. Vaginal discharge. Wet prep positive for yeast. She's not having any symptoms but given the heavier discharge recommend Diflucan 150 mg x1. Follow up if any symptoms develop. 5. Occasional anxiety and airplane phobia. Uses occasional Xanax when flies or significant stress. Xanax 0.25 #30 with one refill provided. 6. Health maintenance. Patient just ate a cheeseburger. I put in for a CBC lipid profile and glucose is a future order and she knows to come back for a fasting blood drawn agrees to do so. Her cholesterol has been elevated in the past and she knows importance of follow up.    Dara Lords MD, 3:44 PM 04/02/2012

## 2012-04-03 LAB — URINALYSIS W MICROSCOPIC + REFLEX CULTURE
Casts: NONE SEEN
Crystals: NONE SEEN
Hgb urine dipstick: NEGATIVE
Specific Gravity, Urine: 1.01 (ref 1.005–1.030)

## 2012-04-04 LAB — URINE CULTURE: Colony Count: 80000

## 2012-04-05 ENCOUNTER — Telehealth: Payer: Self-pay | Admitting: Gynecology

## 2012-04-05 MED ORDER — AMPICILLIN 500 MG PO CAPS: 500.0000 mg | ORAL_CAPSULE | Freq: Four times a day (QID) | ORAL | Status: DC

## 2012-04-05 NOTE — Telephone Encounter (Signed)
Tell patient her urine grew out a little bit of strep bacteria. I will cover her with ampicillin 500 mg 4 times a day x5 days.

## 2012-04-08 ENCOUNTER — Encounter: Payer: Self-pay | Admitting: Gynecology

## 2012-04-12 ENCOUNTER — Encounter: Payer: Self-pay | Admitting: Gynecology

## 2012-04-27 ENCOUNTER — Encounter: Payer: Self-pay | Admitting: Gynecology

## 2012-04-27 ENCOUNTER — Ambulatory Visit (INDEPENDENT_AMBULATORY_CARE_PROVIDER_SITE_OTHER): Payer: BC Managed Care – PPO | Admitting: Gynecology

## 2012-04-27 DIAGNOSIS — R87810 Cervical high risk human papillomavirus (HPV) DNA test positive: Secondary | ICD-10-CM

## 2012-04-27 NOTE — Progress Notes (Signed)
Patient ID: Cheryl Johnson, female   DOB: [birth year], 46 y.o.   MRN: [id number] Patient presents for colposcopy. She has a history of low-grade dysplasia status post cryosurgery 1989.  Most recently had ASCUS negative high-risk HPV 2009 with colposcopy biopsy showing cervicitis. 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 Presents for colposcopy now.  Exam with block assistant Pelvic external BUS vagina normal. Cervix normal deviated to the left. Uterus normal size midline mobile nontender. Adnexa without masses or tenderness  Colposcopy adequate after acetic acid cleansing. Cervix is deviated to the left upper vagina requires manipulation for complete visualization. Small area of acetowhite change at 12:00 transformation zone. Biopsy taken. 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 Colposcopy adequate with acetowhite change at 12:00, biopsy taken. Patient will follow up her results and we'll treat based on these results.  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

## 2012-04-27 NOTE — Addendum Note (Signed)
Addended by: Bertram Savin A on: 04/27/2012 12:51 PM   Modules accepted: Orders

## 2012-04-27 NOTE — Patient Instructions (Signed)
Office will call you with the biopsy results 

## 2012-04-27 NOTE — Progress Notes (Deleted)
Patient presents for colposcopy. She has a history of low-grade dysplasia status post cryosurgery 1989.  Most recently had ASCUS negative high-risk HPV 2009 with colposcopy biopsy showing cervicitis. 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 Presents for colposcopy now.  Exam with block assistant Pelvic external BUS vagina normal. Cervix normal deviated to the left. Uterus normal size midline mobile nontender. Adnexa without masses or tenderness  Colposcopy adequate after acetic acid cleansing. Cervix is deviated to the left upper vagina requires manipulation for complete visualization. Small area of acetowhite change at 12:00 transformation zone. Biopsy taken.  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 Colposcopy adequate with acetowhite change at 12:00, biopsy taken. Patient will follow up her results and we'll treat based on these results.  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

## 2012-09-13 ENCOUNTER — Other Ambulatory Visit: Payer: Self-pay | Admitting: *Deleted

## 2012-09-13 MED ORDER — FLUOXETINE HCL 20 MG PO CAPS: 20.0000 mg | ORAL_CAPSULE | Freq: Every day | ORAL | Status: DC

## 2013-03-17 ENCOUNTER — Other Ambulatory Visit: Payer: Self-pay | Admitting: Gynecology

## 2013-04-07 ENCOUNTER — Other Ambulatory Visit: Payer: Self-pay | Admitting: Gynecology

## 2013-05-02 ENCOUNTER — Encounter: Payer: Self-pay | Admitting: Gynecology

## 2013-05-07 ENCOUNTER — Other Ambulatory Visit: Payer: Self-pay | Admitting: Gynecology

## 2013-06-01 ENCOUNTER — Encounter: Payer: Self-pay | Admitting: Gynecology

## 2013-06-01 ENCOUNTER — Other Ambulatory Visit (HOSPITAL_COMMUNITY)
Admission: RE | Admit: 2013-06-01 | Discharge: 2013-06-01 | Disposition: A | Discharge status: Home / Self Care | Payer: BC Managed Care – PPO | Source: Ambulatory Visit | Attending: Gynecology | Admitting: Gynecology

## 2013-06-01 ENCOUNTER — Ambulatory Visit (INDEPENDENT_AMBULATORY_CARE_PROVIDER_SITE_OTHER): Payer: BC Managed Care – PPO | Admitting: Gynecology

## 2013-06-01 VITALS — BP 110/70 | Ht 63.0 in | Wt 168.0 lb

## 2013-06-01 DIAGNOSIS — R6889 Other general symptoms and signs: Secondary | ICD-10-CM

## 2013-06-01 DIAGNOSIS — IMO0002 Reserved for concepts with insufficient information to code with codable children: Secondary | ICD-10-CM

## 2013-06-01 DIAGNOSIS — Z23 Encounter for immunization: Secondary | ICD-10-CM

## 2013-06-01 DIAGNOSIS — R87810 Cervical high risk human papillomavirus (HPV) DNA test positive: Secondary | ICD-10-CM | POA: Insufficient documentation

## 2013-06-01 DIAGNOSIS — Z124 Encounter for screening for malignant neoplasm of cervix: Secondary | ICD-10-CM | POA: Insufficient documentation

## 2013-06-01 DIAGNOSIS — Z1151 Encounter for screening for human papillomavirus (HPV): Secondary | ICD-10-CM | POA: Insufficient documentation

## 2013-06-01 DIAGNOSIS — Z01419 Encounter for gynecological examination (general) (routine) without abnormal findings: Secondary | ICD-10-CM

## 2013-06-01 LAB — COMPREHENSIVE METABOLIC PANEL
ALBUMIN: 4.6 g/dL (ref 3.5–5.2)
ALK PHOS: 49 U/L (ref 39–117)
ALT: 14 U/L (ref 0–35)
AST: 23 U/L (ref 0–37)
BUN: 16 mg/dL (ref 6–23)
CHLORIDE: 99 meq/L (ref 96–112)
CO2: 27 mEq/L (ref 19–32)
Calcium: 10.2 mg/dL (ref 8.4–10.5)
Creat: 0.9 mg/dL (ref 0.50–1.10)
Glucose, Bld: 98 mg/dL (ref 70–99)
POTASSIUM: 3.9 meq/L (ref 3.5–5.3)
Sodium: 137 mEq/L (ref 135–145)
TOTAL PROTEIN: 7.3 g/dL (ref 6.0–8.3)
Total Bilirubin: 0.5 mg/dL (ref 0.2–1.2)

## 2013-06-01 LAB — LIPID PANEL
Cholesterol: 211 mg/dL — ABNORMAL HIGH (ref 0–200)
HDL: 68 mg/dL (ref >39)
LDL CALC: 117 mg/dL — AB (ref 0–99)
Total CHOL/HDL Ratio: 3.1 Ratio
Triglycerides: 130 mg/dL (ref <150)
VLDL: 26 mg/dL (ref 0–40)

## 2013-06-01 LAB — URINALYSIS W MICROSCOPIC + REFLEX CULTURE
Bilirubin Urine: NEGATIVE
CRYSTALS: NONE SEEN
Casts: NONE SEEN
Glucose, UA: NEGATIVE mg/dL
Hgb urine dipstick: NEGATIVE
Ketones, ur: NEGATIVE mg/dL
NITRITE: POSITIVE — AB
Protein, ur: NEGATIVE mg/dL
RBC / HPF: NONE SEEN RBC/hpf (ref <3)
SPECIFIC GRAVITY, URINE: 1.014 (ref 1.005–1.030)
Urobilinogen, UA: 0.2 mg/dL (ref 0.0–1.0)
pH: 6 (ref 5.0–8.0)

## 2013-06-01 LAB — CBC WITH DIFFERENTIAL/PLATELET
BASOS ABS: 0 10*3/uL (ref 0.0–0.1)
Basophils Relative: 1 % (ref 0–1)
Eosinophils Absolute: 0.1 10*3/uL (ref 0.0–0.7)
Eosinophils Relative: 2 % (ref 0–5)
HEMATOCRIT: 39.9 % (ref 36.0–46.0)
HEMOGLOBIN: 13.8 g/dL (ref 12.0–15.0)
Lymphocytes Relative: 26 % (ref 12–46)
Lymphs Abs: 1.6 10*3/uL (ref 0.7–4.0)
MCH: 32.2 pg (ref 26.0–34.0)
MCHC: 34.6 g/dL (ref 30.0–36.0)
MCV: 93.2 fL (ref 78.0–100.0)
MONOS PCT: 7 % (ref 3–12)
Monocytes Absolute: 0.4 10*3/uL (ref 0.1–1.0)
NEUTROS ABS: 3.8 10*3/uL (ref 1.7–7.7)
Neutrophils Relative %: 64 % (ref 43–77)
Platelets: 322 10*3/uL (ref 150–400)
RBC: 4.28 MIL/uL (ref 3.87–5.11)
RDW: 13.1 % (ref 11.5–15.5)
WBC: 6 10*3/uL (ref 4.0–10.5)

## 2013-06-01 MED ORDER — NORETHINDRONE ACET-ETHINYL EST 1.5-30 MG-MCG PO TABS: ORAL_TABLET | ORAL | Status: DC

## 2013-06-01 MED ORDER — FLUOXETINE HCL 20 MG PO CAPS: 20.0000 mg | ORAL_CAPSULE | Freq: Every day | ORAL | Status: DC

## 2013-06-01 MED ORDER — ALPRAZOLAM 0.25 MG PO TABS: 0.2500 mg | ORAL_TABLET | Freq: Every evening | ORAL | Status: DC | PRN

## 2013-06-01 NOTE — Patient Instructions (Signed)
Follow up in one year, sooner as needed. 

## 2013-06-01 NOTE — Progress Notes (Signed)
Cheryl JeffersonKathlene Johnson 19-Dec-1965 [id number]        48 y.o.  [id number] for annual exam.  Several issues noted below.  Past medical history,surgical history, problem list, medications, allergies, family history and social history were all reviewed and documented in the EPIC chart.  ROS:  Performed and pertinent positives and negatives are included in the history, assessment and plan .  Exam: Kim assistant Filed Vitals:   06/01/13 0842  BP: 110/70  Height: 5\' 3"  (1.6 m)  Weight: 168 lb (76.204 kg)   General appearance  Normal Skin grossly normal Head/Neck normal with no cervical or supraclavicular adenopathy thyroid normal Lungs  clear Cardiac RR, without RMG Abdominal  soft, nontender, without masses, organomegaly or hernia Breasts  examined lying and sitting without masses, retractions, discharge or axillary adenopathy. Pelvic  Ext/BUS/vagina  Normal  Cervix  Normal. Pap/HPV done  Uterus  anteverted, normal size, shape and contour, midline and mobile nontender   Adnexa  Without masses or tenderness    Anus and perineum  Normal   Rectovaginal  Normal sphincter tone without palpated masses or tenderness.    Assessment/Plan:  48 y.o. [name] female for annual exam regular menses, oral contraceptives.   1. Oral contraceptives. Using oral contraceptives for menstrual regulation. Currently not sexually active. Has been on this for years. Options to stop now she as she is not using it for contraception discussed. Patient is not interested and wants to continue. Does have a past history of cigarette smoking but quit years ago. Increased risk of stroke heart attack DVT reviewed understood and accepted. Refill Microgestin 1.5/30x1 year. 2. Pap smear/HPV done.  She has a history of low-grade dysplasia status post cryosurgery 1989.  Most recently had ASCUS negative high-risk HPV 2009 with colposcopy biopsy showing cervicitis.  Pap smear 03/2012 was normal cytology but was positive for high-risk HPV  subtype 18. Followup colposcopy showed acetowhite area at 12:00 transformations and with biopsy LGSIL. If Pap smear normal plan repeat in 1 year. Otherwise then we'll triage based upon results.  3. Mammography 04/2013. Continue with annual mammography. SBE monthly reviewed. 4. Anxiety. Patient on Prozac 20 mg daily doing well wants to continue. Lots of stressors in her life. Refill x1 year. Does use Xanax when necessary insomnia particularly when she travels. 0.25 mg #30 with one refill. 5. Health maintenance. Baseline CBC comprehensive metabolic panel lipid profile urinalysis ordered. Followup one year, sooner as needed.   Note: This document was prepared with digital dictation and possible smart phrase technology. Any transcriptional errors that result from this process are unintentional.   [name],Xcaret Morad P MD, 9:27 AM 06/01/2013

## 2013-06-02 LAB — URINE CULTURE
Colony Count: NO GROWTH
Organism ID, Bacteria: NO GROWTH

## 2013-06-08 ENCOUNTER — Encounter: Payer: Self-pay | Admitting: Gynecology

## 2013-07-15 ENCOUNTER — Ambulatory Visit (INDEPENDENT_AMBULATORY_CARE_PROVIDER_SITE_OTHER): Payer: BC Managed Care – PPO | Admitting: Gynecology

## 2013-07-15 ENCOUNTER — Encounter: Payer: Self-pay | Admitting: Gynecology

## 2013-07-15 DIAGNOSIS — N898 Other specified noninflammatory disorders of vagina: Secondary | ICD-10-CM

## 2013-07-15 DIAGNOSIS — R87810 Cervical high risk human papillomavirus (HPV) DNA test positive: Secondary | ICD-10-CM

## 2013-07-15 LAB — WET PREP FOR TRICH, YEAST, CLUE
Clue Cells Wet Prep HPF POC: NONE SEEN
TRICH WET PREP: NONE SEEN
YEAST WET PREP: NONE SEEN

## 2013-07-15 NOTE — Patient Instructions (Signed)
Follow up in one year for annual exam 

## 2013-07-15 NOTE — Progress Notes (Signed)
Patient ID: Cheryl Johnson, female   DOB: 09-10-1965, 48 y.o.   MRN: [id number] Cheryl JeffersonKathlene Johnson 09-10-1965 [id number]        48 y.o.  [id number] presents for colposcopy.  She has a history of low-grade dysplasia status post cryosurgery 1989.  Most recently had ASCUS negative high-risk HPV 2009 with colposcopy biopsy showing cervicitis.  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 Followup colposcopy showed acetowhite area at 12:00 transformations and with biopsy LGSIL. Most recent Pap smear was adequate normal but did have a positive high risk HPV screen. Negative for 16/18/45. I asked her to come back for a possibly just be extra careful to assure that we were not missing anything.   Past medical history,surgical history, problem list, medications, allergies, family history and social history were all reviewed and documented in the EPIC chart.  Exam: Kim assistant General appearance  Normal External BUS vagina normal. Cervix deviated to the left lateral upper vaginal vault. Uterus normal size mobile nontender. Adnexa without masses or tenderness.  Colposcopy after acetic acid cleanse is adequate normal. No biopsies taken. Her cervix does deviate to the left upper vagina and requires manipulation to fully visualize.  Assessment/Plan:  48 y.o. [id number] history as above. Colposcopy is normal. I did ask about the cervix deviating and she states that it always comments about this during exams and it is uncomfortable for her with every exam. I just never noted this before and record it now for the record and future reference. Recommend followup in 1 year with Pap smear.  Vaginal discharge. She did have a heavier discharge and on questioning there are no symptoms. Wet prep is negative and I suspect this is physiologic and no treatment given.   Note: This document was prepared with digital dictation and possible smart phrase technology. Any transcriptional errors  that result from this process are unintentional.   [name],Naheim Burgen P MD, 12:31 PM 07/15/2013

## 2013-09-24 ENCOUNTER — Other Ambulatory Visit: Payer: Self-pay | Admitting: Gynecology

## 2014-02-27 ENCOUNTER — Encounter: Payer: Self-pay | Admitting: Gynecology

## 2014-05-05 ENCOUNTER — Other Ambulatory Visit: Payer: Self-pay | Admitting: Gynecology

## 2014-05-30 ENCOUNTER — Encounter: Payer: Self-pay | Admitting: Gynecology

## 2014-06-02 ENCOUNTER — Encounter: Payer: Self-pay | Admitting: Gynecology

## 2014-06-02 ENCOUNTER — Other Ambulatory Visit (HOSPITAL_COMMUNITY)
Admission: RE | Admit: 2014-06-02 | Discharge: 2014-06-02 | Disposition: A | Discharge status: Home / Self Care | Payer: BLUE CROSS/BLUE SHIELD | Source: Ambulatory Visit | Attending: Gynecology | Admitting: Gynecology

## 2014-06-02 ENCOUNTER — Ambulatory Visit (INDEPENDENT_AMBULATORY_CARE_PROVIDER_SITE_OTHER): Payer: BLUE CROSS/BLUE SHIELD | Admitting: Gynecology

## 2014-06-02 VITALS — BP 120/76 | Ht 64.0 in | Wt 176.0 lb

## 2014-06-02 DIAGNOSIS — Z1151 Encounter for screening for human papillomavirus (HPV): Secondary | ICD-10-CM | POA: Insufficient documentation

## 2014-06-02 DIAGNOSIS — Z01411 Encounter for gynecological examination (general) (routine) with abnormal findings: Secondary | ICD-10-CM | POA: Diagnosis not present

## 2014-06-02 DIAGNOSIS — Z23 Encounter for immunization: Secondary | ICD-10-CM

## 2014-06-02 DIAGNOSIS — Z01419 Encounter for gynecological examination (general) (routine) without abnormal findings: Secondary | ICD-10-CM

## 2014-06-02 DIAGNOSIS — R87810 Cervical high risk human papillomavirus (HPV) DNA test positive: Secondary | ICD-10-CM

## 2014-06-02 LAB — COMPREHENSIVE METABOLIC PANEL
ALBUMIN: 4.3 g/dL (ref 3.5–5.2)
ALT: 13 U/L (ref 0–35)
AST: 22 U/L (ref 0–37)
Alkaline Phosphatase: 44 U/L (ref 39–117)
BILIRUBIN TOTAL: 0.4 mg/dL (ref 0.2–1.2)
BUN: 13 mg/dL (ref 6–23)
CALCIUM: 9.9 mg/dL (ref 8.4–10.5)
CHLORIDE: 102 meq/L (ref 96–112)
CO2: 22 mEq/L (ref 19–32)
Creat: 0.86 mg/dL (ref 0.50–1.10)
Glucose, Bld: 92 mg/dL (ref 70–99)
Potassium: 3.9 mEq/L (ref 3.5–5.3)
Sodium: 138 mEq/L (ref 135–145)
Total Protein: 7.1 g/dL (ref 6.0–8.3)

## 2014-06-02 LAB — CBC WITH DIFFERENTIAL/PLATELET
Basophils Absolute: 0 10*3/uL (ref 0.0–0.1)
Basophils Relative: 0 % (ref 0–1)
EOS PCT: 1 % (ref 0–5)
Eosinophils Absolute: 0.1 10*3/uL (ref 0.0–0.7)
HEMATOCRIT: 40.3 % (ref 36.0–46.0)
Hemoglobin: 13.5 g/dL (ref 12.0–15.0)
Lymphocytes Relative: 25 % (ref 12–46)
Lymphs Abs: 1.8 10*3/uL (ref 0.7–4.0)
MCH: 32.1 pg (ref 26.0–34.0)
MCHC: 33.5 g/dL (ref 30.0–36.0)
MCV: 96 fL (ref 78.0–100.0)
MONO ABS: 0.4 10*3/uL (ref 0.1–1.0)
MONOS PCT: 6 % (ref 3–12)
MPV: 9.4 fL (ref 8.6–12.4)
NEUTROS ABS: 4.8 10*3/uL (ref 1.7–7.7)
Neutrophils Relative %: 68 % (ref 43–77)
PLATELETS: 353 10*3/uL (ref 150–400)
RBC: 4.2 MIL/uL (ref 3.87–5.11)
RDW: 12.9 % (ref 11.5–15.5)
WBC: 7.1 10*3/uL (ref 4.0–10.5)

## 2014-06-02 LAB — LIPID PANEL
Cholesterol: 228 mg/dL — ABNORMAL HIGH (ref 0–200)
HDL: 75 mg/dL (ref >39)
LDL Cholesterol: 114 mg/dL — ABNORMAL HIGH (ref 0–99)
Total CHOL/HDL Ratio: 3 Ratio
Triglycerides: 196 mg/dL — ABNORMAL HIGH (ref <150)
VLDL: 39 mg/dL (ref 0–40)

## 2014-06-02 MED ORDER — FLUOXETINE HCL 20 MG PO CAPS
20.0000 mg | ORAL_CAPSULE | Freq: Every day | ORAL | Status: DC
Start: 2014-06-02 — End: 2015-06-08

## 2014-06-02 MED ORDER — NORETHINDRONE ACET-ETHINYL EST 1.5-30 MG-MCG PO TABS: 1.0000 | ORAL_TABLET | Freq: Every day | ORAL | Status: DC

## 2014-06-02 MED ORDER — ALPRAZOLAM 0.25 MG PO TABS: 0.2500 mg | ORAL_TABLET | Freq: Every evening | ORAL | Status: DC | PRN

## 2014-06-02 NOTE — Patient Instructions (Signed)
You may obtain a copy of any labs that were done today by logging onto MyChart as outlined in the instructions provided with your AVS (after visit summary). The office will not call with normal lab results but certainly if there are any significant abnormalities then we will contact you.   Health Maintenance, Female A healthy lifestyle and preventative care can promote health and wellness.  Maintain regular health, dental, and eye exams.  Eat a healthy diet. Foods like vegetables, fruits, whole grains, low-fat dairy products, and lean protein foods contain the nutrients you need without too many calories. Decrease your intake of foods high in solid fats, added sugars, and salt. Get information about a proper diet from your caregiver, if necessary.  Regular physical exercise is one of the most important things you can do for your health. Most adults should get at least 150 minutes of moderate-intensity exercise (any activity that increases your heart rate and causes you to sweat) each week. In addition, most adults need muscle-strengthening exercises on 2 or more days a week.   Maintain a healthy weight. The body mass index (BMI) is a screening tool to identify possible weight problems. It provides an estimate of body fat based on height and weight. Your caregiver can help determine your BMI, and can help you achieve or maintain a healthy weight. For adults 20 years and older:  A BMI below 18.5 is considered underweight.  A BMI of 18.5 to 24.9 is normal.  A BMI of 25 to 29.9 is considered overweight.  A BMI of 30 and above is considered obese.  Maintain normal blood lipids and cholesterol by exercising and minimizing your intake of saturated fat. Eat a balanced diet with plenty of fruits and vegetables. Blood tests for lipids and cholesterol should begin at age 61 and be repeated every 5 years. If your lipid or cholesterol levels are high, you are over 50, or you are a high risk for heart  disease, you may need your cholesterol levels checked more frequently.Ongoing high lipid and cholesterol levels should be treated with medicines if diet and exercise are not effective.  If you smoke, find out from your caregiver how to quit. If you do not use tobacco, do not start.  Lung cancer screening is recommended for adults aged 33 80 years who are at high risk for developing lung cancer because of a history of smoking. Yearly low-dose computed tomography (CT) is recommended for people who have at least a 30-pack-year history of smoking and are a current smoker or have quit within the past 15 years. A pack year of smoking is smoking an average of 1 pack of cigarettes a day for 1 year (for example: 1 pack a day for 30 years or 2 packs a day for 15 years). Yearly screening should continue until the smoker has stopped smoking for at least 15 years. Yearly screening should also be stopped for people who develop a health problem that would prevent them from having lung cancer treatment.  If you are pregnant, do not drink alcohol. If you are breastfeeding, be very cautious about drinking alcohol. If you are not pregnant and choose to drink alcohol, do not exceed 1 drink per day. One drink is considered to be 12 ounces (355 mL) of beer, 5 ounces (148 mL) of wine, or 1.5 ounces (44 mL) of liquor.  Avoid use of street drugs. Do not share needles with anyone. Ask for help if you need support or instructions about stopping  the use of drugs.  High blood pressure causes heart disease and increases the risk of stroke. Blood pressure should be checked at least every 1 to 2 years. Ongoing high blood pressure should be treated with medicines, if weight loss and exercise are not effective.  If you are 59 to 48 years old, ask your caregiver if you should take aspirin to prevent strokes.  Diabetes screening involves taking a blood sample to check your fasting blood sugar level. This should be done once every 3  years, after age 91, if you are within normal weight and without risk factors for diabetes. Testing should be considered at a younger age or be carried out more frequently if you are overweight and have at least 1 risk factor for diabetes.  Breast cancer screening is essential preventative care for women. You should practice "breast self-awareness." This means understanding the normal appearance and feel of your breasts and may include breast self-examination. Any changes detected, no matter how small, should be reported to a caregiver. Women in their 66s and 30s should have a clinical breast exam (CBE) by a caregiver as part of a regular health exam every 1 to 3 years. After age 101, women should have a CBE every year. Starting at age 100, women should consider having a mammogram (breast X-ray) every year. Women who have a family history of breast cancer should talk to their caregiver about genetic screening. Women at a high risk of breast cancer should talk to their caregiver about having an MRI and a mammogram every year.  Breast cancer gene (BRCA)-related cancer risk assessment is recommended for women who have family members with BRCA-related cancers. BRCA-related cancers include breast, ovarian, tubal, and peritoneal cancers. Having family members with these cancers may be associated with an increased risk for harmful changes (mutations) in the breast cancer genes BRCA1 and BRCA2. Results of the assessment will determine the need for genetic counseling and BRCA1 and BRCA2 testing.  The Pap test is a screening test for cervical cancer. Women should have a Pap test starting at age 57. Between ages 25 and 35, Pap tests should be repeated every 2 years. Beginning at age 37, you should have a Pap test every 3 years as long as the past 3 Pap tests have been normal. If you had a hysterectomy for a problem that was not cancer or a condition that could lead to cancer, then you no longer need Pap tests. If you are  between ages 50 and 76, and you have had normal Pap tests going back 10 years, you no longer need Pap tests. If you have had past treatment for cervical cancer or a condition that could lead to cancer, you need Pap tests and screening for cancer for at least 20 years after your treatment. If Pap tests have been discontinued, risk factors (such as a new sexual partner) need to be reassessed to determine if screening should be resumed. Some women have medical problems that increase the chance of getting cervical cancer. In these cases, your caregiver may recommend more frequent screening and Pap tests.  The human papillomavirus (HPV) test is an additional test that may be used for cervical cancer screening. The HPV test looks for the virus that can cause the cell changes on the cervix. The cells collected during the Pap test can be tested for HPV. The HPV test could be used to screen women aged 44 years and older, and should be used in women of any age  who have unclear Pap test results. After the age of 55, women should have HPV testing at the same frequency as a Pap test.  Colorectal cancer can be detected and often prevented. Most routine colorectal cancer screening begins at the age of 44 and continues through age 20. However, your caregiver may recommend screening at an earlier age if you have risk factors for colon cancer. On a yearly basis, your caregiver may provide home test kits to check for hidden blood in the stool. Use of a small camera at the end of a tube, to directly examine the colon (sigmoidoscopy or colonoscopy), can detect the earliest forms of colorectal cancer. Talk to your caregiver about this at age 86, when routine screening begins. Direct examination of the colon should be repeated every 5 to 10 years through age 13, unless early forms of pre-cancerous polyps or small growths are found.  Hepatitis C blood testing is recommended for all people born from [birth year] through 1964 and any  individual with known risks for hepatitis C.  Practice safe sex. Use condoms and avoid high-risk sexual practices to reduce the spread of sexually transmitted infections (STIs). Sexually active women aged 36 and younger should be checked for Chlamydia, which is a common sexually transmitted infection. Older women with new or multiple partners should also be tested for Chlamydia. Testing for other STIs is recommended if you are sexually active and at increased risk.  Osteoporosis is a disease in which the bones lose minerals and strength with aging. This can result in serious bone fractures. The risk of osteoporosis can be identified using a bone density scan. Women ages 20 and over and women at risk for fractures or osteoporosis should discuss screening with their caregivers. Ask your caregiver whether you should be taking a calcium supplement or vitamin D to reduce the rate of osteoporosis.  Menopause can be associated with physical symptoms and risks. Hormone replacement therapy is available to decrease symptoms and risks. You should talk to your caregiver about whether hormone replacement therapy is right for you.  Use sunscreen. Apply sunscreen liberally and repeatedly throughout the day. You should seek shade when your shadow is shorter than you. Protect yourself by wearing long sleeves, pants, a wide-brimmed hat, and sunglasses year round, whenever you are outdoors.  Notify your caregiver of new moles or changes in moles, especially if there is a change in shape or color. Also notify your caregiver if a mole is larger than the size of a pencil eraser.  Stay current with your immunizations. Document Released: 10/28/2010 Document Revised: 08/09/2012 Document Reviewed: 10/28/2010 [facility] Patient Information 2014 [employer].

## 2014-06-02 NOTE — Progress Notes (Signed)
Cheryl Johnson 04-Aug-1965 [id number]        49 y.o.  [id number] for annual exam.  Several issues noted below.  Past medical history,surgical history, problem list, medications, allergies, family history and social history were all reviewed and documented as reviewed in the EPIC chart.  ROS:  Performed with pertinent positives and negatives included in the history, assessment and plan.   Additional significant findings :  none   Exam: [facility] personassistant Filed Vitals:   06/02/14 1202  BP: 120/76  Height: 5\' 4"  (1.626 m)  Weight: 176 lb (79.833 kg)   General appearance:  Normal affect, orientation and appearance. Skin: Grossly normal HEENT: Without gross lesions.  No cervical or supraclavicular adenopathy. Thyroid normal.  Lungs:  Clear without wheezing, rales or rhonchi Cardiac: RR, without RMG Abdominal:  Soft, nontender, without masses, guarding, rebound, organomegaly or hernia Breasts:  Examined lying and sitting without masses, retractions, discharge or axillary adenopathy. Pelvic:  Ext/BUS/vagina normal  Cervix deviated to left upper vagina. Pap smear/HPV done  Uterus anteverted, normal size, shape and contour, midline and mobile nontender   Adnexa  Without masses or tenderness    Anus and perineum  Normal   Rectovaginal  Normal sphincter tone without palpated masses or tenderness.    Assessment/Plan:  49 y.o. [id number] female for annual exam with regular menses, oral contraceptives.   1. Oral contraceptives. Patient not such active but wants to continue on the oral contraceptives for menstrual regulation and possible need for contraception in the future. Doing well otherwise. Not being followed for any medical issues. Does have a history of smoking years ago but has quit. We discussed the risks of stroke heart attack DVT and she understands and accepts these. Refill Microgestin 1.5/301 year. 2. Pap smear/HPV done. History of low-grade dysplasia status post cryosurgery 1989. ASCUS  with negative high-risk HPV 2009 with biopsy showing cervicitis. Pap smear 2013 with normal cytology but positive high risk HPV subtype 18. Colposcopy biopsy with low-grade SIL. Most recent Pap smear 2015 with normal cytology positive high risk HPV negative subtype 16, 18/45.  Colposcopy adequate normal with no biopsies 05/2013. Follow up for Pap smear results done today. Triage based on results. 3. Mammography 04/2014. Continue with annual mammography. SBE monthly reviewed. 4. Anxiety. Patient continues on fluoxetine 20 mg daily and occasional Xanax 0.25 mg. Refill Prozac 1 year. Xanax 0.25 mg #30 with 2 refills provided. 5. Health maintenance. Baseline CBC comprehensive metabolic panel lipid profile urinalysis and thyroid panel due to complaints of difficulty losing weight ordered. Follow up in one year assuming Pap smear normal otherwise triage based on results.     [name],TIMOTHY P MD, 12:58 PM 06/02/2014

## 2014-06-03 LAB — URINALYSIS W MICROSCOPIC + REFLEX CULTURE
Bilirubin Urine: NEGATIVE
CASTS: NONE SEEN
CRYSTALS: NONE SEEN
Glucose, UA: NEGATIVE mg/dL
HGB URINE DIPSTICK: NEGATIVE
KETONES UR: NEGATIVE mg/dL
NITRITE: POSITIVE — AB
PROTEIN: NEGATIVE mg/dL
Specific Gravity, Urine: 1.013 (ref 1.005–1.030)
Squamous Epithelial / LPF: NONE SEEN
Urobilinogen, UA: 0.2 mg/dL (ref 0.0–1.0)
pH: 6.5 (ref 5.0–8.0)

## 2014-06-03 LAB — THYROID PANEL
Free T4: 1.01 ng/dL (ref 0.80–1.80)
T3 Uptake: 21 % — ABNORMAL LOW (ref 22–35)
T4, Total: 8.5 ug/dL (ref 4.5–12.0)
TSH: 1.7 u[IU]/mL (ref 0.350–4.500)

## 2014-06-04 LAB — URINE CULTURE

## 2014-06-05 ENCOUNTER — Other Ambulatory Visit: Payer: Self-pay | Admitting: Gynecology

## 2014-06-05 DIAGNOSIS — E782 Mixed hyperlipidemia: Secondary | ICD-10-CM

## 2014-06-07 LAB — CYTOLOGY - PAP

## 2015-05-08 ENCOUNTER — Other Ambulatory Visit: Payer: Self-pay | Admitting: Gynecology

## 2015-06-04 ENCOUNTER — Encounter: Payer: Self-pay | Admitting: Gynecology

## 2015-06-08 ENCOUNTER — Encounter: Payer: Self-pay | Admitting: Gynecology

## 2015-06-08 ENCOUNTER — Ambulatory Visit (INDEPENDENT_AMBULATORY_CARE_PROVIDER_SITE_OTHER): Payer: BLUE CROSS/BLUE SHIELD | Admitting: Gynecology

## 2015-06-08 ENCOUNTER — Other Ambulatory Visit (HOSPITAL_COMMUNITY)
Admission: RE | Admit: 2015-06-08 | Discharge: 2015-06-08 | Disposition: A | Discharge status: Home / Self Care | Payer: BLUE CROSS/BLUE SHIELD | Source: Ambulatory Visit | Attending: Gynecology | Admitting: Gynecology

## 2015-06-08 VITALS — BP 118/74 | Ht 64.75 in | Wt 175.0 lb

## 2015-06-08 DIAGNOSIS — Z13220 Encounter for screening for lipoid disorders: Secondary | ICD-10-CM | POA: Diagnosis not present

## 2015-06-08 DIAGNOSIS — Z01419 Encounter for gynecological examination (general) (routine) without abnormal findings: Secondary | ICD-10-CM | POA: Diagnosis not present

## 2015-06-08 DIAGNOSIS — Z23 Encounter for immunization: Secondary | ICD-10-CM

## 2015-06-08 DIAGNOSIS — Z1151 Encounter for screening for human papillomavirus (HPV): Secondary | ICD-10-CM | POA: Diagnosis not present

## 2015-06-08 LAB — COMPREHENSIVE METABOLIC PANEL
ALBUMIN: 3.9 g/dL (ref 3.6–5.1)
ALK PHOS: 46 U/L (ref 33–115)
ALT: 10 U/L (ref 6–29)
AST: 17 U/L (ref 10–35)
BUN: 14 mg/dL (ref 7–25)
CHLORIDE: 102 mmol/L (ref 98–110)
CO2: 28 mmol/L (ref 20–31)
CREATININE: 0.88 mg/dL (ref 0.50–1.10)
Calcium: 9.1 mg/dL (ref 8.6–10.2)
Glucose, Bld: 90 mg/dL (ref 65–99)
POTASSIUM: 4.2 mmol/L (ref 3.5–5.3)
Sodium: 139 mmol/L (ref 135–146)
TOTAL PROTEIN: 6.6 g/dL (ref 6.1–8.1)
Total Bilirubin: 0.3 mg/dL (ref 0.2–1.2)

## 2015-06-08 LAB — CBC WITH DIFFERENTIAL/PLATELET
Basophils Absolute: 0.1 10*3/uL (ref 0.0–0.1)
Basophils Relative: 1 % (ref 0–1)
Eosinophils Absolute: 0.2 10*3/uL (ref 0.0–0.7)
Eosinophils Relative: 3 % (ref 0–5)
HEMATOCRIT: 40.1 % (ref 36.0–46.0)
HEMOGLOBIN: 13.4 g/dL (ref 12.0–15.0)
LYMPHS ABS: 2 10*3/uL (ref 0.7–4.0)
Lymphocytes Relative: 35 % (ref 12–46)
MCH: 32.1 pg (ref 26.0–34.0)
MCHC: 33.4 g/dL (ref 30.0–36.0)
MCV: 95.9 fL (ref 78.0–100.0)
MONOS PCT: 11 % (ref 3–12)
MPV: 9.5 fL (ref 8.6–12.4)
Monocytes Absolute: 0.6 10*3/uL (ref 0.1–1.0)
NEUTROS ABS: 2.9 10*3/uL (ref 1.7–7.7)
NEUTROS PCT: 50 % (ref 43–77)
Platelets: 314 10*3/uL (ref 150–400)
RBC: 4.18 MIL/uL (ref 3.87–5.11)
RDW: 12.9 % (ref 11.5–15.5)
WBC: 5.7 10*3/uL (ref 4.0–10.5)

## 2015-06-08 LAB — LIPID PANEL
CHOL/HDL RATIO: 4 ratio (ref <=5.0)
Cholesterol: 232 mg/dL — ABNORMAL HIGH (ref 125–200)
HDL: 58 mg/dL (ref >=46)
LDL Cholesterol: 128 mg/dL (ref <130)
TRIGLYCERIDES: 228 mg/dL — AB (ref <150)
VLDL: 46 mg/dL — AB (ref <30)

## 2015-06-08 MED ORDER — NORETHINDRONE ACET-ETHINYL EST 1.5-30 MG-MCG PO TABS: 1.0000 | ORAL_TABLET | Freq: Every day | ORAL | Status: DC

## 2015-06-08 NOTE — Patient Instructions (Signed)

## 2015-06-08 NOTE — Progress Notes (Signed)
Cheryl Johnson April 14, 1966 [id number]        50 y.o.  [id number]  for annual exam.  Doing well without complaints  Past medical history,surgical history, problem list, medications, allergies, family history and social history were all reviewed and documented as reviewed in the EPIC chart.  ROS:  Performed with pertinent positives and negatives included in the history, assessment and plan.   Additional significant findings :  normal   Exam: Product manager Vitals:   06/08/15 1536  BP: 118/74  Height: 5' 4.75" (1.645 m)  Weight: 175 lb (79.379 kg)   General appearance:  Normal affect, orientation and appearance. Skin: Grossly normal HEENT: Without gross lesions.  No cervical or supraclavicular adenopathy. Thyroid normal.  Lungs:  Clear without wheezing, rales or rhonchi Cardiac: RR, without RMG Abdominal:  Soft, nontender, without masses, guarding, rebound, organomegaly or hernia Breasts:  Examined lying and sitting without masses, retractions, discharge or axillary adenopathy. Pelvic:  Ext/BUS/vagina normal  Cervix normal. Pap smear/HPV  Uterus anteverted, normal size, shape and contour, midline and mobile nontender   Adnexa without masses or tenderness    Anus and perineum normal   Rectovaginal normal sphincter tone without palpated masses or tenderness.    Assessment/Plan:  50 y.o. [id number] female for annual exam with regular menses, oral contraceptives.   1. Oral contraceptives. Patient doing well and wants refill. I again reviewed the risks to include increased risk of thrombosis. Does have a history of smoking in the past but has quit years ago. Possible increased risk associated with this history of smoking and advancing age. Alternative options reviewed. Patient comfortable continuing and refill 1 year provided Microgestin 1.5/30 2. Pap smear/HPV normal 2016. Pap smear/HPV today.  History of low-grade dysplasia status post cryosurgery 1989. ASCUS with negative high-risk  HPV 2009 with biopsy showing cervicitis. Pap smear 2013 with normal cytology but positive high risk HPV subtype 18. Colposcopy biopsy with low-grade SIL. Most recent Pap smear 2015 with normal cytology positive high risk HPV negative subtype 16, 18/45.  Colposcopy adequate normal with no biopsies 05/2013. 3. Mammography 05/2015. Continue with annual mammography when due. SBE reviewed. 4. Health maintenance. Baseline CBC, comprehensive metabolic panel, lipid profile and urinalysis ordered. Did have elevated lipids last year but never returned for asking lipid profile as recommended. Follow up in one year, sooner as needed.   Dara Lords MD, 4:06 PM 06/08/2015

## 2015-06-08 NOTE — Addendum Note (Signed)
Addended by: Richardson Chiquito on: 06/08/2015 04:17 PM   Modules accepted: Orders

## 2015-06-09 LAB — URINALYSIS W MICROSCOPIC + REFLEX CULTURE
Bilirubin Urine: NEGATIVE
Casts: NONE SEEN [LPF]
Crystals: NONE SEEN [HPF]
Glucose, UA: NEGATIVE
Ketones, ur: NEGATIVE
Nitrite: POSITIVE — AB
Protein, ur: NEGATIVE
Specific Gravity, Urine: 1.014 (ref 1.001–1.035)
Squamous Epithelial / HPF: NONE SEEN [HPF]
Yeast: NONE SEEN [HPF]
pH: 6 (ref 5.0–8.0)

## 2015-06-10 LAB — URINE CULTURE
Colony Count: NO GROWTH
Organism ID, Bacteria: NO GROWTH

## 2015-06-12 LAB — CYTOLOGY - PAP

## 2015-06-18 ENCOUNTER — Other Ambulatory Visit: Payer: Self-pay | Admitting: Gynecology

## 2015-06-18 DIAGNOSIS — Z13220 Encounter for screening for lipoid disorders: Secondary | ICD-10-CM

## 2015-06-22 ENCOUNTER — Other Ambulatory Visit: Payer: BLUE CROSS/BLUE SHIELD

## 2015-06-22 DIAGNOSIS — Z13220 Encounter for screening for lipoid disorders: Secondary | ICD-10-CM

## 2015-06-22 LAB — LIPID PANEL
CHOL/HDL RATIO: 3.6 ratio (ref <=5.0)
CHOLESTEROL: 209 mg/dL — AB (ref 125–200)
HDL: 58 mg/dL (ref >=46)
LDL Cholesterol: 127 mg/dL (ref <130)
Triglycerides: 121 mg/dL (ref <150)
VLDL: 24 mg/dL (ref <30)

## 2015-11-04 DIAGNOSIS — S63630A Sprain of interphalangeal joint of right index finger, initial encounter: Secondary | ICD-10-CM | POA: Diagnosis not present

## 2016-05-31 ENCOUNTER — Encounter: Payer: Self-pay | Admitting: Gynecology

## 2016-05-31 DIAGNOSIS — Z1231 Encounter for screening mammogram for malignant neoplasm of breast: Secondary | ICD-10-CM | POA: Diagnosis not present

## 2016-06-09 ENCOUNTER — Encounter: Payer: Self-pay | Admitting: Gynecology

## 2016-06-09 ENCOUNTER — Ambulatory Visit (INDEPENDENT_AMBULATORY_CARE_PROVIDER_SITE_OTHER): Payer: BLUE CROSS/BLUE SHIELD | Admitting: Gynecology

## 2016-06-09 VITALS — BP 122/76 | Ht 65.0 in | Wt 147.0 lb

## 2016-06-09 DIAGNOSIS — Z1151 Encounter for screening for human papillomavirus (HPV): Secondary | ICD-10-CM

## 2016-06-09 DIAGNOSIS — Z01419 Encounter for gynecological examination (general) (routine) without abnormal findings: Secondary | ICD-10-CM

## 2016-06-09 DIAGNOSIS — N951 Menopausal and female climacteric states: Secondary | ICD-10-CM | POA: Diagnosis not present

## 2016-06-09 DIAGNOSIS — E789 Disorder of lipoprotein metabolism, unspecified: Secondary | ICD-10-CM | POA: Diagnosis not present

## 2016-06-09 MED ORDER — NORETHINDRONE ACET-ETHINYL EST 1.5-30 MG-MCG PO TABS: 1.0000 | ORAL_TABLET | Freq: Every day | ORAL | 4 refills | Status: DC

## 2016-06-09 NOTE — Patient Instructions (Signed)

## 2016-06-09 NOTE — Addendum Note (Signed)
Addended by: Dayna BarkerGARDNER, Diedra Sinor K on: 06/09/2016 04:37 PM   Modules accepted: Orders

## 2016-06-09 NOTE — Progress Notes (Addendum)
    Cheryl JeffersonKathlene Johnson [birth year] [id number]        51 y.o.  [id number] for annual exam.    Past medical history,surgical history, problem list, medications, allergies, family history and social history were all reviewed and documented as reviewed in the EPIC chart.  ROS:  Performed with pertinent positives and negatives included in the history, assessment and plan.   Additional significant findings :  None   Exam: Cheryl PortelaKim Johnson assistant Vitals:   06/09/16 1601  BP: 122/76  Weight: 147 lb (66.7 kg)  Height: 5\' 5"  (1.651 m)   Body mass index is 24.46 kg/m.  General appearance:  Normal affect, orientation and appearance. Skin: Grossly normal HEENT: Without gross lesions.  No cervical or supraclavicular adenopathy. Thyroid normal.  Lungs:  Clear without wheezing, rales or rhonchi Cardiac: RR, without RMG Abdominal:  Soft, nontender, without masses, guarding, rebound, organomegaly or hernia Breasts:  Examined lying and sitting without masses, retractions, discharge or axillary adenopathy. Pelvic:  Ext, BUS, Vagina normal  Cervix normal, Pap smear/HPV done  Uterus anteverted, normal size, shape and contour, midline and mobile nontender   Adnexa without masses or tenderness    Anus and perineum normal   Rectovaginal normal sphincter tone without palpated masses or tenderness.    Assessment/Plan:  51 y.o. [name] female for annual exam with regular menses, oral contraceptives.   1. Oral contraceptives. Patient well continued. Does note some hot flushes although didn't track them not sure whether during the pill free week or sporadically. We'll check TSH to make sure it is not thyroid dysfunction. She's going to keep track of them and see when they occur. Options for every other month to every third month withdrawal and oral contraceptives if indeed it does occur during the pill free week discussed. Increased risk of thrombosis associated with BCPs reviewed. Is not being followed for  medical issues but did smoke in the past but has quit for a number of years. At this point the patient is comfortable continuing and I refilled her Microgestin 1.5/301 year. 2. Pap smear/HPV 2017. Pap smear/HPV today.  History of low-grade dysplasia with cryosurgery 1989. ASCUS negative high-risk HPV 2009. Pap smear 2013 with normal cytology positive high-risk HPV subtypes 18. Colposcopy biopsy LGSIL. Pap smear 2015 was normal cytology but positive high-risk HPV negative subtype 16, 18/45. Colposcopy was adequate normal with no biopsies taken. Subsequent Pap smears negative. If this Pap smear/HPV is negative given 3 in a row, will pursue less frequent screening intervals. 3. Mammography 05/2016. Continue with annual mammography when due. SBE monthly reviewed. 4. Health maintenance. Future orders placed for baseline CBC, CMP, TSH, lipid profile. Follow up in one year, sooner as needed.   Cheryl LordsFONTAINE,[name] P MD, 4:31 PM 06/09/2016

## 2016-06-10 LAB — URINALYSIS W MICROSCOPIC + REFLEX CULTURE
Bilirubin Urine: NEGATIVE
Casts: NONE SEEN [LPF]
Crystals: NONE SEEN [HPF]
GLUCOSE, UA: NEGATIVE
Hgb urine dipstick: NEGATIVE
Ketones, ur: NEGATIVE
NITRITE: NEGATIVE
PH: 6 (ref 5.0–8.0)
Protein, ur: NEGATIVE
RBC / HPF: NONE SEEN RBC/HPF (ref <=2)
SPECIFIC GRAVITY, URINE: 1.017 (ref 1.001–1.035)
YEAST: NONE SEEN [HPF]

## 2016-06-11 LAB — PAP IG AND HPV HIGH-RISK: HPV DNA High Risk: NOT DETECTED

## 2016-06-11 LAB — URINE CULTURE: ORGANISM ID, BACTERIA: NO GROWTH

## 2016-06-13 ENCOUNTER — Other Ambulatory Visit: Payer: BLUE CROSS/BLUE SHIELD

## 2016-06-13 DIAGNOSIS — E789 Disorder of lipoprotein metabolism, unspecified: Secondary | ICD-10-CM

## 2016-06-13 DIAGNOSIS — Z01419 Encounter for gynecological examination (general) (routine) without abnormal findings: Secondary | ICD-10-CM

## 2016-06-13 DIAGNOSIS — N951 Menopausal and female climacteric states: Secondary | ICD-10-CM | POA: Diagnosis not present

## 2016-06-13 LAB — LIPID PANEL
Cholesterol: 189 mg/dL (ref <200)
HDL: 61 mg/dL (ref >50)
LDL Cholesterol: 103 mg/dL — ABNORMAL HIGH (ref <100)
Total CHOL/HDL Ratio: 3.1 Ratio (ref <5.0)
Triglycerides: 123 mg/dL (ref <150)
VLDL: 25 mg/dL (ref <30)

## 2016-06-13 LAB — COMPREHENSIVE METABOLIC PANEL
ALBUMIN: 4 g/dL (ref 3.6–5.1)
ALT: 14 U/L (ref 6–29)
AST: 20 U/L (ref 10–35)
Alkaline Phosphatase: 41 U/L (ref 33–115)
BUN: 15 mg/dL (ref 7–25)
CALCIUM: 9.3 mg/dL (ref 8.6–10.2)
CHLORIDE: 110 mmol/L (ref 98–110)
CO2: 23 mmol/L (ref 20–31)
CREATININE: 1.04 mg/dL (ref 0.50–1.10)
Glucose, Bld: 103 mg/dL — ABNORMAL HIGH (ref 65–99)
Potassium: 4.7 mmol/L (ref 3.5–5.3)
SODIUM: 141 mmol/L (ref 135–146)
TOTAL PROTEIN: 6.4 g/dL (ref 6.1–8.1)
Total Bilirubin: 0.4 mg/dL (ref 0.2–1.2)

## 2016-06-13 LAB — CBC WITH DIFFERENTIAL/PLATELET
BASOS ABS: 72 {cells}/uL (ref 0–200)
Basophils Relative: 1 %
EOS ABS: 72 {cells}/uL (ref 15–500)
Eosinophils Relative: 1 %
HCT: 38.9 % (ref 35.0–45.0)
HEMOGLOBIN: 12.7 g/dL (ref 11.7–15.5)
LYMPHS ABS: 1512 {cells}/uL (ref 850–3900)
Lymphocytes Relative: 21 %
MCH: 31.6 pg (ref 27.0–33.0)
MCHC: 32.6 g/dL (ref 32.0–36.0)
MCV: 96.8 fL (ref 80.0–100.0)
MONO ABS: 432 {cells}/uL (ref 200–950)
MPV: 9.1 fL (ref 7.5–12.5)
Monocytes Relative: 6 %
NEUTROS ABS: 5112 {cells}/uL (ref 1500–7800)
NEUTROS PCT: 71 %
Platelets: 278 10*3/uL (ref 140–400)
RBC: 4.02 MIL/uL (ref 3.80–5.10)
RDW: 12.8 % (ref 11.0–15.0)
WBC: 7.2 10*3/uL (ref 3.8–10.8)

## 2016-06-13 LAB — TSH: TSH: 1.44 mIU/L

## 2016-06-28 ENCOUNTER — Other Ambulatory Visit: Payer: Self-pay | Admitting: Gynecology

## 2016-10-13 DIAGNOSIS — E669 Obesity, unspecified: Secondary | ICD-10-CM | POA: Diagnosis not present

## 2016-10-13 DIAGNOSIS — Z79899 Other long term (current) drug therapy: Secondary | ICD-10-CM | POA: Diagnosis not present

## 2016-10-13 DIAGNOSIS — Z6830 Body mass index (BMI) 30.0-30.9, adult: Secondary | ICD-10-CM | POA: Diagnosis not present

## 2016-10-13 DIAGNOSIS — L237 Allergic contact dermatitis due to plants, except food: Secondary | ICD-10-CM | POA: Diagnosis not present

## 2017-06-13 ENCOUNTER — Encounter: Payer: Self-pay | Admitting: Gynecology

## 2017-06-13 DIAGNOSIS — Z1231 Encounter for screening mammogram for malignant neoplasm of breast: Secondary | ICD-10-CM | POA: Diagnosis not present

## 2017-06-19 ENCOUNTER — Encounter: Payer: Self-pay | Admitting: Gynecology

## 2017-06-19 ENCOUNTER — Ambulatory Visit: Payer: BLUE CROSS/BLUE SHIELD | Admitting: Gynecology

## 2017-06-19 ENCOUNTER — Encounter: Payer: BLUE CROSS/BLUE SHIELD | Admitting: Gynecology

## 2017-06-19 VITALS — BP 124/72 | Ht 64.5 in | Wt 176.0 lb

## 2017-06-19 DIAGNOSIS — Z23 Encounter for immunization: Secondary | ICD-10-CM | POA: Diagnosis not present

## 2017-06-19 DIAGNOSIS — Z01419 Encounter for gynecological examination (general) (routine) without abnormal findings: Secondary | ICD-10-CM

## 2017-06-19 DIAGNOSIS — Z13220 Encounter for screening for lipoid disorders: Secondary | ICD-10-CM

## 2017-06-19 MED ORDER — NORETHINDRONE ACET-ETHINYL EST 1.5-30 MG-MCG PO TABS: 1.0000 | ORAL_TABLET | Freq: Every day | ORAL | 4 refills | Status: DC

## 2017-06-19 NOTE — Progress Notes (Signed)
    Cheryl Johnson 05-25-1965 [id number]        [age] y.o.  [id number] for annual gynecologic exam.  Doing well without gynecologic complaints  Past medical history,surgical history, problem list, medications, allergies, family history and social history were all reviewed and documented as reviewed in the EPIC chart.  ROS:  Performed with pertinent positives and negatives included in the history, assessment and plan.   Additional significant findings : None   Exam: Kennon PortelaKim Gardner assistant Vitals:   06/19/17 1217  BP: 124/72  Weight: 176 lb (79.8 kg)  Height: 5' 4.5" (1.638 m)   Body mass index is 29.74 kg/m.  General appearance:  Normal affect, orientation and appearance. Skin: Grossly normal HEENT: Without gross lesions.  No cervical or supraclavicular adenopathy. Thyroid normal.  Lungs:  Clear without wheezing, rales or rhonchi Cardiac: RR, without RMG Abdominal:  Soft, nontender, without masses, guarding, rebound, organomegaly or hernia Breasts:  Examined lying and sitting without masses, retractions, discharge or axillary adenopathy. Pelvic:  Ext, BUS, Vagina: Normal  Cervix: Normal  Uterus: Anteverted, normal size, shape and contour, midline and mobile nontender   Adnexa: Without masses or tenderness    Anus and perineum: Normal   Rectovaginal: Normal sphincter tone without palpated masses or tenderness.    Assessment/Plan:  52 y.o. [name] female for annual gynecologic exam with regular menses, oral contraceptives.   1. Oral contraceptives.  Patient continues on low-dose oral contraceptives.  Reviewed the issues at age 52.  Risks to include thrombosis such as stroke heart attack DVT discussed.  Not being followed for any medical issues.  Does have a remote history of smoking but has not done so for years.  Options to stop now, keep menstrual calendar use backup contraception and go from there versus FSH check end of pill free week and if elevated stop the pills, if normal  continue and lastly to continue the pills regardless 1 more year and then stop next year.  After considering each option the patient prefers to continue the pills for now we will plan on stopping next year.  She clearly understands and accepts the risks.  Refill times 1 year of her Microgestin 1.5/30 provided. 2. Pap smear/HPV 2018.  No Pap smear done today.  History of low-grade positive Pap smears as well as positive HPV in the past as outlined in my previous 06/09/2016 note.  She now has had 3 Pap smears/HPV negative in a row.  We will go to a less frequent screening interval per current screening guidelines. 3. Mammography this month.  Continue with annual mammography next year.  Breast exam normal today. 4. Colonoscopy never.  I reviewed current recommendations and recommended she move towards scheduling a screening colonoscopy. 5. Health maintenance.  Baseline CBC, CMP and lipid profile ordered.  TSH normal last year.  Follow-up in 1 year, sooner as needed.   Dara Lordsimothy P Adreona Brand MD, 1:03 PM 06/19/2017

## 2017-06-19 NOTE — Patient Instructions (Signed)
Follow up in one year for annual exam 

## 2017-06-20 LAB — COMPREHENSIVE METABOLIC PANEL
AG Ratio: 1.6 (calc) (ref 1.0–2.5)
ALT: 8 U/L (ref 6–29)
AST: 15 U/L (ref 10–35)
Albumin: 4.1 g/dL (ref 3.6–5.1)
Alkaline phosphatase (APISO): 42 U/L (ref 33–130)
BUN: 16 mg/dL (ref 7–25)
CO2: 27 mmol/L (ref 20–32)
CREATININE: 0.94 mg/dL (ref 0.50–1.05)
Calcium: 10.4 mg/dL (ref 8.6–10.4)
Chloride: 106 mmol/L (ref 98–110)
GLUCOSE: 93 mg/dL (ref 65–99)
Globulin: 2.6 g/dL (calc) (ref 1.9–3.7)
Potassium: 3.9 mmol/L (ref 3.5–5.3)
Sodium: 141 mmol/L (ref 135–146)
Total Bilirubin: 0.3 mg/dL (ref 0.2–1.2)
Total Protein: 6.7 g/dL (ref 6.1–8.1)

## 2017-06-20 LAB — CBC WITH DIFFERENTIAL/PLATELET
BASOS PCT: 0.7 %
Basophils Absolute: 50 cells/uL (ref 0–200)
EOS ABS: 79 {cells}/uL (ref 15–500)
EOS PCT: 1.1 %
HCT: 37.8 % (ref 35.0–45.0)
HEMOGLOBIN: 12.9 g/dL (ref 11.7–15.5)
Lymphs Abs: 2189 cells/uL (ref 850–3900)
MCH: 32.3 pg (ref 27.0–33.0)
MCHC: 34.1 g/dL (ref 32.0–36.0)
MCV: 94.5 fL (ref 80.0–100.0)
MONOS PCT: 7.6 %
MPV: 9.8 fL (ref 7.5–12.5)
NEUTROS ABS: 4334 {cells}/uL (ref 1500–7800)
Neutrophils Relative %: 60.2 %
PLATELETS: 331 10*3/uL (ref 140–400)
RBC: 4 10*6/uL (ref 3.80–5.10)
RDW: 11.7 % (ref 11.0–15.0)
TOTAL LYMPHOCYTE: 30.4 %
WBC mixed population: 547 cells/uL (ref 200–950)
WBC: 7.2 10*3/uL (ref 3.8–10.8)

## 2017-06-20 LAB — LIPID PANEL
CHOL/HDL RATIO: 3.3 (calc) (ref <5.0)
Cholesterol: 211 mg/dL — ABNORMAL HIGH (ref <200)
HDL: 64 mg/dL (ref >50)
LDL CHOLESTEROL (CALC): 117 mg/dL — AB
NON-HDL CHOLESTEROL (CALC): 147 mg/dL — AB (ref <130)
TRIGLYCERIDES: 179 mg/dL — AB (ref <150)

## 2017-06-24 ENCOUNTER — Other Ambulatory Visit: Payer: Self-pay | Admitting: *Deleted

## 2017-06-24 DIAGNOSIS — E785 Hyperlipidemia, unspecified: Secondary | ICD-10-CM

## 2017-12-07 DIAGNOSIS — H5213 Myopia, bilateral: Secondary | ICD-10-CM | POA: Diagnosis not present

## 2017-12-29 DIAGNOSIS — L578 Other skin changes due to chronic exposure to nonionizing radiation: Secondary | ICD-10-CM | POA: Diagnosis not present

## 2017-12-29 DIAGNOSIS — L573 Poikiloderma of Civatte: Secondary | ICD-10-CM | POA: Diagnosis not present

## 2017-12-29 DIAGNOSIS — D225 Melanocytic nevi of trunk: Secondary | ICD-10-CM | POA: Diagnosis not present

## 2018-06-19 ENCOUNTER — Encounter: Payer: Self-pay | Admitting: Gynecology

## 2018-06-19 DIAGNOSIS — Z1231 Encounter for screening mammogram for malignant neoplasm of breast: Secondary | ICD-10-CM | POA: Diagnosis not present

## 2018-07-02 ENCOUNTER — Encounter: Payer: Self-pay | Admitting: Gynecology

## 2018-07-02 ENCOUNTER — Ambulatory Visit (INDEPENDENT_AMBULATORY_CARE_PROVIDER_SITE_OTHER): Payer: BLUE CROSS/BLUE SHIELD | Admitting: Gynecology

## 2018-07-02 VITALS — BP 118/76 | Ht 64.0 in | Wt 177.0 lb

## 2018-07-02 DIAGNOSIS — Z13220 Encounter for screening for lipoid disorders: Secondary | ICD-10-CM

## 2018-07-02 DIAGNOSIS — Z01419 Encounter for gynecological examination (general) (routine) without abnormal findings: Secondary | ICD-10-CM | POA: Diagnosis not present

## 2018-07-02 LAB — CBC WITH DIFFERENTIAL/PLATELET
Absolute Monocytes: 502 cells/uL (ref 200–950)
Basophils Absolute: 38 cells/uL (ref 0–200)
Basophils Relative: 0.7 %
EOS ABS: 119 {cells}/uL (ref 15–500)
Eosinophils Relative: 2.2 %
HEMATOCRIT: 37.2 % (ref 35.0–45.0)
Hemoglobin: 12.6 g/dL (ref 11.7–15.5)
LYMPHS ABS: 1550 {cells}/uL (ref 850–3900)
MCH: 32.8 pg (ref 27.0–33.0)
MCHC: 33.9 g/dL (ref 32.0–36.0)
MCV: 96.9 fL (ref 80.0–100.0)
MPV: 9.9 fL (ref 7.5–12.5)
Monocytes Relative: 9.3 %
NEUTROS ABS: 3191 {cells}/uL (ref 1500–7800)
NEUTROS PCT: 59.1 %
PLATELETS: 297 10*3/uL (ref 140–400)
RBC: 3.84 10*6/uL (ref 3.80–5.10)
RDW: 11.8 % (ref 11.0–15.0)
TOTAL LYMPHOCYTE: 28.7 %
WBC: 5.4 10*3/uL (ref 3.8–10.8)

## 2018-07-02 LAB — LIPID PANEL
Cholesterol: 220 mg/dL — ABNORMAL HIGH (ref <200)
HDL: 64 mg/dL (ref >=50)
LDL Cholesterol (Calc): 121 mg/dL (calc) — ABNORMAL HIGH
NON-HDL CHOLESTEROL (CALC): 156 mg/dL — AB (ref <130)
Total CHOL/HDL Ratio: 3.4 (calc) (ref <5.0)
Triglycerides: 234 mg/dL — ABNORMAL HIGH (ref <150)

## 2018-07-02 LAB — COMPREHENSIVE METABOLIC PANEL
AG Ratio: 1.8 (calc) (ref 1.0–2.5)
ALT: 11 U/L (ref 6–29)
AST: 19 U/L (ref 10–35)
Albumin: 4.3 g/dL (ref 3.6–5.1)
Alkaline phosphatase (APISO): 40 U/L (ref 37–153)
BUN: 14 mg/dL (ref 7–25)
CO2: 26 mmol/L (ref 20–32)
Calcium: 9.2 mg/dL (ref 8.6–10.4)
Chloride: 106 mmol/L (ref 98–110)
Creat: 0.91 mg/dL (ref 0.50–1.05)
GLUCOSE: 89 mg/dL (ref 65–99)
Globulin: 2.4 g/dL (calc) (ref 1.9–3.7)
Potassium: 4.3 mmol/L (ref 3.5–5.3)
Sodium: 140 mmol/L (ref 135–146)
Total Bilirubin: 0.4 mg/dL (ref 0.2–1.2)
Total Protein: 6.7 g/dL (ref 6.1–8.1)

## 2018-07-02 NOTE — Patient Instructions (Signed)
Stop the birth control pills as we discussed.  Call if significant irregular bleeding or significant menopausal symptoms.  Schedule your colonoscopy with either:  [facility]   Address: [address] [place], [place], [place] [address]  [phone]    or  [facility]  Address: [address] [place], [place], [place] [address]  [phone]

## 2018-07-02 NOTE — Progress Notes (Signed)
    Cheryl Johnson [birth year] [id number]        53 y.o.  [id number] for annual gynecologic exam.  Without gynecologic complaints  Past medical history,surgical history, problem list, medications, allergies, family history and social history were all reviewed and documented as reviewed in the EPIC chart.  ROS:  Performed with pertinent positives and negatives included in the history, assessment and plan.   Additional significant findings : Cheryl Johnson   Exam: None assistant Vitals:   07/02/18 0817  BP: 118/76  Weight: 177 lb (80.3 kg)  Height: 5\' 4"  (1.626 m)   Body mass index is 30.38 kg/m.  General appearance:  Normal affect, orientation and appearance. Skin: Grossly normal HEENT: Without gross lesions.  No cervical or supraclavicular adenopathy. Thyroid normal.  Lungs:  Clear without wheezing, rales or rhonchi Cardiac: RR, without RMG Abdominal:  Soft, nontender, without masses, guarding, rebound, organomegaly or hernia Breasts:  Examined lying and sitting without masses, retractions, discharge or axillary adenopathy. Pelvic:  Ext, BUS, Vagina: Normal  Cervix: Normal  Uterus: Anteverted, normal size, shape and contour, midline and mobile nontender   Adnexa: Without masses or tenderness    Anus and perineum: Normal   Rectovaginal: Normal sphincter tone without palpated masses or tenderness.    Assessment/Plan:  53 y.o. [id number] female for annual gynecologic exam.   1. Oral contraceptives.  Patient continues on oral contraceptives.  Recommended she stop them now and will keep a menstrual and symptom calendar.  If irregular bleeding or significant menopausal symptoms she will call.  Need for backup contraception if sexually active discussed.  Patient notes that at this point she is not currently sexually active. 2. Pap smear/HPV 2018.  No Pap smear done today.  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  Pap smears/HPV negative x3 in a row.  Plan follow-up Pap smear/HPV  at 5-year interval per current screening guidelines. 3. Mammography 05/2018.  Continue with annual mammography next year.  Breast exam normal today. 4. Colonoscopy never.  Recommend screening colonoscopy.  Names and numbers provided.  Patient agrees to call and schedule. 5. Health maintenance.  Baseline CBC, CMP and lipid profile ordered.  Follow-up 1 year, sooner as needed.   Dara Lords MD, 8:51 AM 07/02/2018

## 2018-07-05 ENCOUNTER — Other Ambulatory Visit: Payer: Self-pay | Admitting: *Deleted

## 2018-07-05 DIAGNOSIS — E782 Mixed hyperlipidemia: Secondary | ICD-10-CM

## 2018-07-13 ENCOUNTER — Encounter: Payer: Self-pay | Admitting: Gynecology

## 2018-07-16 ENCOUNTER — Other Ambulatory Visit: Payer: BLUE CROSS/BLUE SHIELD

## 2018-07-16 ENCOUNTER — Ambulatory Visit (INDEPENDENT_AMBULATORY_CARE_PROVIDER_SITE_OTHER): Payer: BLUE CROSS/BLUE SHIELD | Admitting: Gynecology

## 2018-07-16 ENCOUNTER — Other Ambulatory Visit: Payer: Self-pay

## 2018-07-16 DIAGNOSIS — Z23 Encounter for immunization: Secondary | ICD-10-CM | POA: Diagnosis not present

## 2018-07-16 DIAGNOSIS — E782 Mixed hyperlipidemia: Secondary | ICD-10-CM | POA: Diagnosis not present

## 2018-07-17 LAB — LIPID PANEL
Cholesterol: 202 mg/dL — ABNORMAL HIGH (ref <200)
HDL: 68 mg/dL (ref >=50)
LDL CHOLESTEROL (CALC): 109 mg/dL — AB
Non-HDL Cholesterol (Calc): 134 mg/dL (calc) — ABNORMAL HIGH (ref <130)
Total CHOL/HDL Ratio: 3 (calc) (ref <5.0)
Triglycerides: 136 mg/dL (ref <150)

## 2018-07-19 ENCOUNTER — Encounter: Payer: Self-pay | Admitting: Gynecology

## 2018-07-20 NOTE — Telephone Encounter (Signed)
Less than 200 is normal for total cholesterol and the patient's was 202.  Less than 100 is normal for LDL in the patient's was 109.  These are both minimal elevations.  Her HDL of 68 is good.  I do not think I would recommend statins but I would recommend exercising on a regular basis, adding omega-3 fish oil and rechecking it at some point down the line which I think in 1 year would be okay.  Certainly if she wants to show this to a primary physician for their opinion that is appropriate.

## 2018-08-03 ENCOUNTER — Encounter: Payer: Self-pay | Admitting: Gynecology

## 2019-01-19 ENCOUNTER — Encounter: Payer: Self-pay | Admitting: Gynecology

## 2019-02-25 DIAGNOSIS — Z23 Encounter for immunization: Secondary | ICD-10-CM | POA: Diagnosis not present

## 2019-02-25 DIAGNOSIS — N390 Urinary tract infection, site not specified: Secondary | ICD-10-CM | POA: Diagnosis not present

## 2019-02-25 DIAGNOSIS — Z6828 Body mass index (BMI) 28.0-28.9, adult: Secondary | ICD-10-CM | POA: Diagnosis not present

## 2019-04-05 ENCOUNTER — Telehealth: Payer: Self-pay

## 2019-04-05 MED ORDER — FLUOXETINE HCL 20 MG PO CAPS: 20.0000 mg | ORAL_CAPSULE | Freq: Every day | ORAL | 5 refills | Status: DC

## 2019-04-05 NOTE — Telephone Encounter (Signed)
Patient advised. Rx sent. 

## 2019-04-05 NOTE — Telephone Encounter (Signed)
I am okay with fluoxetine 20 mg #30 with 5 refills.  Call if any issues once starting.  Due for annual in March.

## 2019-04-05 NOTE — Telephone Encounter (Signed)
You prescribed Prozac 20 mg. for her from 2013-2016 but she has been off now for a few years. She said life events in the last 7-8 mos have taken a toll and she feels like she would like to go back on this Rx. She asked if she needed a visit to come see you or would you just send Rx for her?

## 2019-04-16 DIAGNOSIS — Z20828 Contact with and (suspected) exposure to other viral communicable diseases: Secondary | ICD-10-CM | POA: Diagnosis not present

## 2019-07-05 DIAGNOSIS — Z1231 Encounter for screening mammogram for malignant neoplasm of breast: Secondary | ICD-10-CM | POA: Diagnosis not present

## 2019-07-07 ENCOUNTER — Other Ambulatory Visit: Payer: Self-pay

## 2019-07-08 ENCOUNTER — Encounter: Payer: Self-pay | Admitting: Obstetrics and Gynecology

## 2019-07-08 ENCOUNTER — Ambulatory Visit: Payer: BLUE CROSS/BLUE SHIELD | Admitting: Obstetrics and Gynecology

## 2019-07-08 VITALS — BP 118/76 | Ht 64.0 in | Wt 171.0 lb

## 2019-07-08 DIAGNOSIS — Z01419 Encounter for gynecological examination (general) (routine) without abnormal findings: Secondary | ICD-10-CM | POA: Diagnosis not present

## 2019-07-08 DIAGNOSIS — E785 Hyperlipidemia, unspecified: Secondary | ICD-10-CM | POA: Diagnosis not present

## 2019-07-08 DIAGNOSIS — Z13220 Encounter for screening for lipoid disorders: Secondary | ICD-10-CM

## 2019-07-08 NOTE — Progress Notes (Signed)
Cheryl Johnson June 29, 1965 [id number]  SUBJECTIVE:  54 y.o. Y1P5093 female for annual routine gynecologic exam. She has no gynecologic concerns.  She did stop taking the birth control pill last year.  She started having a menstrual period around this February 14th after about 6 months of amenorrhea and did bleed through about the last week.  She has had no bleeding since.  Helping her daughter with wedding planning.  Current Outpatient Medications  Medication Sig Dispense Refill  . FLUoxetine (PROZAC) 20 MG capsule Take 1 capsule (20 mg total) by mouth daily. 30 capsule 5  . ibuprofen (ADVIL,MOTRIN) 200 MG tablet Take 200 mg by mouth every 6 (six) hours as needed.      . Loratadine (CLARITIN PO) Take by mouth.      . Multiple Vitamin (MULTIVITAMIN) capsule Take 1 capsule by mouth daily.    . Calcium-Magnesium-Vitamin D (CALCIUM 1200+D3 PO) Take by mouth.    . Norethindrone Acetate-Ethinyl Estradiol (MICROGESTIN) 1.5-30 MG-MCG tablet Take 1 tablet by mouth daily. (Patient not taking: Reported on 07/08/2019) 63 tablet 4   No current facility-administered medications for this visit.   Allergies: Iodine and Erythromycin  Patient's last menstrual period was 06/13/2019.  Past medical history,surgical history, problem list, medications, allergies, family history and social history were all reviewed and documented as reviewed in the EPIC chart.  ROS:  Feeling well. No dyspnea or chest pain on exertion.  No abdominal pain, change in bowel habits, black or bloody stools.  No urinary tract symptoms. GYN ROS: no abnormal bleeding, pelvic pain or discharge, no breast pain or new or enlarging lumps on self exam. No neurological complaints.   OBJECTIVE:  BP 118/76   Ht 5\' 4"  (1.626 m)   Wt 171 lb (77.6 kg)   LMP 06/13/2019   BMI 29.35 kg/m  The patient appears well, alert, oriented x 3, in no distress. ENT normal.  Neck supple. No cervical or supraclavicular adenopathy or thyromegaly.  Lungs  are clear, good air entry, no wheezes, rhonchi or rales. S1 and S2 normal, no murmurs, regular rate and rhythm.  Abdomen soft without tenderness, guarding, mass or organomegaly.  Neurological is normal, no focal findings.  BREAST EXAM: breasts appear normal, no suspicious masses, no skin or nipple changes or axillary nodes  PELVIC EXAM: VULVA: normal appearing vulva with no masses, tenderness or lesions, VAGINA: normal appearing vagina with normal color and discharge, no lesions, CERVIX: normal appearing cervix without discharge or lesions, UTERUS: uterus is normal size, shape, consistency and nontender, ADNEXA: normal adnexa in size, nontender and no masses  Chaperone: Caryn Bee present during the examination  ASSESSMENT:  54 y.o. [id number] here for annual gynecologic exam  PLAN:   1. Perimenopausal. Not yet 1 year of amenorrhea. Had recent prolonged period but no bleeding since. She did stop the birth control pills as recommended after her appointment last year. 2. Pap smear/HPV 2018.  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  Pap smears/HPV negative x3 in a row.  Plan follow-up Pap smear/HPV at 5-year interval in 2023 per current screening guidelines. 3. Mammogram 06/2019.  Normal breast exam today.  She is reminded to schedule an annual mammogram this year when due. 4. Colonoscopy not yet done.  This study is recommended for routine colon cancer screening starting at age 16 and the patient is reminded of this.  Colon cancer by the time it is symptomatic often is late stage.  Better to catch it early.  She  will work on arranging someone to drive her to the exam and will schedule.   5. Health maintenance.  Low-grade elevation of cholesterol and LDL just over the normal limits last year.  Interval improvement from the year prior. Has only had coffee today.  Will proceed to lab to check CBC, CMP, lipid panel.  Return annually or sooner, prn.  Theresia Majors MD  07/08/19

## 2019-07-08 NOTE — Patient Instructions (Signed)
Please remember to schedule your colonoscopy this year.

## 2019-07-09 LAB — COMPREHENSIVE METABOLIC PANEL
AG Ratio: 1.7 (calc) (ref 1.0–2.5)
ALT: 15 U/L (ref 6–29)
AST: 23 U/L (ref 10–35)
Albumin: 4.3 g/dL (ref 3.6–5.1)
Alkaline phosphatase (APISO): 69 U/L (ref 37–153)
BUN: 18 mg/dL (ref 7–25)
CO2: 27 mmol/L (ref 20–32)
Calcium: 9.7 mg/dL (ref 8.6–10.4)
Chloride: 102 mmol/L (ref 98–110)
Creat: 0.98 mg/dL (ref 0.50–1.05)
Globulin: 2.6 g/dL (calc) (ref 1.9–3.7)
Glucose, Bld: 85 mg/dL (ref 65–99)
Potassium: 4.2 mmol/L (ref 3.5–5.3)
Sodium: 139 mmol/L (ref 135–146)
Total Bilirubin: 0.4 mg/dL (ref 0.2–1.2)
Total Protein: 6.9 g/dL (ref 6.1–8.1)

## 2019-07-09 LAB — CBC
HCT: 38.3 % (ref 35.0–45.0)
Hemoglobin: 12.8 g/dL (ref 11.7–15.5)
MCH: 32.4 pg (ref 27.0–33.0)
MCHC: 33.4 g/dL (ref 32.0–36.0)
MCV: 97 fL (ref 80.0–100.0)
MPV: 9.9 fL (ref 7.5–12.5)
Platelets: 316 10*3/uL (ref 140–400)
RBC: 3.95 10*6/uL (ref 3.80–5.10)
RDW: 11.8 % (ref 11.0–15.0)
WBC: 5.3 10*3/uL (ref 3.8–10.8)

## 2019-07-09 LAB — LIPID PANEL
Cholesterol: 259 mg/dL — ABNORMAL HIGH (ref <200)
HDL: 85 mg/dL (ref >=50)
LDL Cholesterol (Calc): 148 mg/dL (calc) — ABNORMAL HIGH
Non-HDL Cholesterol (Calc): 174 mg/dL (calc) — ABNORMAL HIGH (ref <130)
Total CHOL/HDL Ratio: 3 (calc) (ref <5.0)
Triglycerides: 132 mg/dL (ref <150)

## 2019-09-14 DIAGNOSIS — Z23 Encounter for immunization: Secondary | ICD-10-CM | POA: Diagnosis not present

## 2019-10-03 ENCOUNTER — Other Ambulatory Visit: Payer: Self-pay

## 2019-10-03 MED ORDER — FLUOXETINE HCL 20 MG PO CAPS: 20.0000 mg | ORAL_CAPSULE | Freq: Every day | ORAL | 8 refills | Status: DC

## 2019-11-29 DIAGNOSIS — Z20822 Contact with and (suspected) exposure to covid-19: Secondary | ICD-10-CM | POA: Diagnosis not present

## 2020-02-08 ENCOUNTER — Encounter: Payer: Self-pay | Admitting: Obstetrics and Gynecology

## 2020-02-08 NOTE — Telephone Encounter (Signed)
Patient coming in to see Dr Penni Bombard October 14.

## 2020-02-09 ENCOUNTER — Other Ambulatory Visit: Payer: Self-pay

## 2020-02-09 ENCOUNTER — Encounter: Payer: Self-pay | Admitting: Obstetrics and Gynecology

## 2020-02-09 ENCOUNTER — Ambulatory Visit: Payer: BLUE CROSS/BLUE SHIELD | Admitting: Obstetrics and Gynecology

## 2020-02-09 VITALS — BP 124/80

## 2020-02-09 DIAGNOSIS — N951 Menopausal and female climacteric states: Secondary | ICD-10-CM

## 2020-02-09 DIAGNOSIS — Z3009 Encounter for other general counseling and advice on contraception: Secondary | ICD-10-CM | POA: Diagnosis not present

## 2020-02-09 DIAGNOSIS — Z23 Encounter for immunization: Secondary | ICD-10-CM | POA: Diagnosis not present

## 2020-02-09 MED ORDER — NORETHINDRONE 0.35 MG PO TABS: 1.0000 | ORAL_TABLET | Freq: Every day | ORAL | 3 refills | Status: DC

## 2020-02-09 NOTE — Progress Notes (Signed)
   Cheryl Johnson 26-Nov-1965 [id number]  SUBJECTIVE:  54 y.o. [id number] female presents for discussion of contraception.  Currently with a new sexual partner and wants to avoid any accidental pregnancies.  She is perimenopausal with infrequent periods, LMP 06/13/2019.    Current Outpatient Medications  Medication Sig Dispense Refill  . ibuprofen (ADVIL,MOTRIN) 200 MG tablet Take 200 mg by mouth every 6 (six) hours as needed.      . Loratadine (CLARITIN PO) Take by mouth.      . Multiple Vitamin (MULTIVITAMIN) capsule Take 1 capsule by mouth daily.    Marland Kitchen FLUoxetine (PROZAC) 20 MG capsule Take 1 capsule (20 mg total) by mouth daily. (Patient not taking: Reported on 02/09/2020) 30 capsule 8   No current facility-administered medications for this visit.   Allergies: Iodine and Erythromycin  Patient's last menstrual period was 06/13/2019.  Past medical history,surgical history, problem list, medications, allergies, family history and social history were all reviewed and documented as reviewed in the EPIC chart.    OBJECTIVE:  BP 124/80   LMP 06/13/2019  The patient appears well, alert, oriented, in no distress. PELVIC EXAM: Deferred   ASSESSMENT:  54 y.o. U2P5361 here for contraception counseling  PLAN:  We discussed that likelihood of conception in late perimenopause is low, but until she has been 1 year with amenorrhea it is best to continue with effective contraception if sexually active.  We discussed the option of low-dose combined OCP versus progestin only minipill and the associated risks with estrogens.  Discussed LARC options but she is not interested.  Would not recommend Depo-Provera at this point due to bone density issue.  Likely lower contraceptive efficacy with the minipill but this should be sufficient if taken on a regular basis at the same time every day given the likely low fertility chances at this stage.  We discussed that this is a continuous pill without any hormone  free break so she needs to take the tablet every day at the same time, we discussed the potential for irregular/breakthrough bleeding side effects.  Typical birth control side effects are still possible with the minipill.  She will try this for the next 6 to 12 months, and will then consider discontinuing and following the menstrual pattern from there to see if she is menopausal.   I will send her a prescription.   Theresia Majors MD 02/09/20

## 2020-05-26 DIAGNOSIS — Z20822 Contact with and (suspected) exposure to covid-19: Secondary | ICD-10-CM | POA: Diagnosis not present

## 2020-07-03 DIAGNOSIS — Z1331 Encounter for screening for depression: Secondary | ICD-10-CM | POA: Diagnosis not present

## 2020-07-03 DIAGNOSIS — Z1211 Encounter for screening for malignant neoplasm of colon: Secondary | ICD-10-CM | POA: Diagnosis not present

## 2020-07-03 DIAGNOSIS — Z124 Encounter for screening for malignant neoplasm of cervix: Secondary | ICD-10-CM | POA: Diagnosis not present

## 2020-07-03 DIAGNOSIS — E669 Obesity, unspecified: Secondary | ICD-10-CM | POA: Diagnosis not present

## 2020-07-03 DIAGNOSIS — Z8742 Personal history of other diseases of the female genital tract: Secondary | ICD-10-CM | POA: Diagnosis not present

## 2020-07-12 NOTE — Progress Notes (Signed)
55 y.o. G1P1021 Divorced White or Caucasian female here for annual exam.      Reports night sweats, hot flashes Night sweats 5-6 nights per week, interfering with sleep.  Reports inability of urinating adequate amounts, only urinates a little at a time, has been going on about 2 weeks  Exercises 3 times per week  Her daughter got married last year, now is pregnant about 6 weeks. Will be her first grandchild.  LMP: does not really get periods on Micronor, Last spotting was " several months ago" maybe September Sexually active, does not want any accidents   Does not go to see her PCP much, wants labs done here. Has had elevated Cholesterol in past  No LMP recorded. (Menstrual status: Perimenopausal).          Sexually active: Yes.    The current method of family planning is oral progesterone-only contraceptive.    Exercising: Yes.    personal trainer 3 times a week Smoker:  no  Health Maintenance: Pap:  06-09-16 neg HPV HR neg History of abnormal Pap:  Yes, LSIL 2013 MMG:  07-14-2020 no result yet Colonoscopy:  none BMD:   none TDaP:  2021 Gardasil:   n/a Covid-19: pfizer Hep C testing: not done Screening Labs:    reports that she has quit smoking. She has never used smokeless tobacco. She reports current alcohol use. She reports that she does not use drugs.  Past Medical History:  Diagnosis Date  . ASCUS (atypical squamous cells of undetermined significance) on Pap smear 01/2007, 07/2007   01/2007 NEGATIVE FOR HIGH RISK HPV 07/2007 C&B--CERVICITIS  . Heartburn   . 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 Kidney stone   . 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 Colposcopy with acetowhite change and biopsy showing LGSIL    Past Surgical History:  Procedure Laterality Date  . COLPOSCOPY  03/2012   Biopsy showing LGSIL  . GANGLION CYST  EXCISION    . GYNECOLOGIC CRYOSURGERY    . KIDNEY SURGERY     KIDNEY "VALVE" SURGERY  . KNEE SURGERY    . MOUTH SURGERY      Current Outpatient Medications  Medication Sig Dispense Refill  . ALPRAZolam (XANAX) 0.25 MG tablet Take 0.25 mg by mouth at bedtime as needed for anxiety (as needed).    . COLLAGEN PO Take by mouth.    Marland Kitchen ibuprofen (ADVIL,MOTRIN) 200 MG tablet Take 200 mg by mouth every 6 (six) hours as needed.    . Loratadine (CLARITIN PO) Take by mouth.    . Multiple Vitamin (MULTIVITAMIN) capsule Take 1 capsule by mouth daily.    . norethindrone (MICRONOR) 0.35 MG tablet Take 1 tablet (0.35 mg total) by mouth daily. 84 tablet 3  . Turmeric (QC TUMERIC COMPLEX PO) Take by mouth.     No current facility-administered medications for this visit.    Family History  Problem Relation Age of Onset  . Stroke Father   . Cancer Maternal Aunt        uterine  . Hypertension Maternal Grandmother   . Diabetes Maternal Grandfather   . Alzheimer's disease Maternal Grandfather   . Stroke Paternal Grandmother     Review of Systems  Constitutional: Negative.   HENT: Negative.   Eyes: Negative.   Respiratory: Negative.   Cardiovascular: Negative.   Gastrointestinal: Negative.   Endocrine:  Negative.   Genitourinary:       Urinary urgency, not emptying bladder  Musculoskeletal: Negative.   Skin: Negative.   Allergic/Immunologic: Negative.   Neurological: Negative.   Hematological: Negative.   Psychiatric/Behavioral: Negative.     Exam:   BP 110/64   Pulse 68   Resp 16   Ht 5' 4.5" (1.638 m)   Wt 174 lb (78.9 kg)   BMI 29.41 kg/m   Height: 5' 4.5" (163.8 cm)  General appearance: alert, cooperative and appears stated age, no acute distress Head: Normocephalic, without obvious abnormality Neck: no adenopathy, thyroid normal to inspection and palpation Lungs: clear to auscultation bilaterally Breasts: No axillary or supraclavicular adenopathy, Normal to palpation without  dominant masses Heart: regular rate and rhythm Abdomen: soft, non-tender; no masses,  no organomegaly Extremities: extremities normal, no edema Skin: No rashes or lesions Lymph nodes: Cervical, supraclavicular, and axillary nodes normal. No abnormal inguinal nodes palpated Neurologic: Grossly normal   Pelvic: External genitalia:  no lesions              Urethra:  normal appearing urethra with no masses, tenderness or lesions              Bartholins and Skenes: normal                 Vagina: normal appearing vagina, appropriate for age, white discharge, no lesions              Cervix: neg cervical motion tenderness, no visible lesions, short cervix, looks almost flush with vaginal tissue             Bimanual Exam:   Uterus:  tender, ?mildly enlarged              Adnexa: Left palpates WNL, difficult to assess right adnexa r/t tenderness               Anus: small abrasion Right side, mild edema  Wandra Scot, CMA Chaperone was present for exam.  A:  Well woman exam with routine gynecological exam  Laboratory exam ordered as part of routine general medical examination - Plan: Urinalysis,Complete w/RFL Culture  Plan: WET PREP FOR TRICH, YEAST, CLUE Urinary frequency - Plan: Urinalysis,Complete w/RFL Culture  Screening, lipid - Plan: Lipid Profile  Screening for diabetes mellitus - Plan: Comp Met (CMET)  Cystitis - Plan: CBC, sulfamethoxazole-trimethoprim (BACTRIM DS) 800-160 MG tablet  Encounter for birth control pills maintenance - Plan: Norethindrone-Ethinyl Estradiol-Fe Biphas (LO LOESTRIN FE) 1 MG-10 MCG / 10 MCG tablet. Will change OCP from Micronor to LoLoestrin to help with vasomotor symptoms. Discussed pros/cons/risk/benefits of estrogen  Uterine tenderness - Plan: C. trachomatis/N. gonorrhoeae RNA, US PELVIS TRANSVAGINAL NON-OB (TV ONLY)    Pap : due 2023  Mammogram:done yesterday, awaiting results  Colonscopy: pt has called GI, awaiting appointment  Offered to repeat  pelvic exam at a different time (prior to scheduling ultrasound) r/t current bladder infection, pt desires to schedule pelvic ultrasound

## 2020-07-14 ENCOUNTER — Encounter: Payer: Self-pay | Admitting: Obstetrics and Gynecology

## 2020-07-14 DIAGNOSIS — Z1231 Encounter for screening mammogram for malignant neoplasm of breast: Secondary | ICD-10-CM | POA: Diagnosis not present

## 2020-07-16 ENCOUNTER — Other Ambulatory Visit: Payer: Self-pay

## 2020-07-16 ENCOUNTER — Encounter: Payer: Self-pay | Admitting: Nurse Practitioner

## 2020-07-16 ENCOUNTER — Ambulatory Visit: Payer: BLUE CROSS/BLUE SHIELD | Admitting: Nurse Practitioner

## 2020-07-16 VITALS — BP 110/64 | HR 68 | Temp 98.3°F | Resp 16 | Ht 64.5 in | Wt 174.0 lb

## 2020-07-16 DIAGNOSIS — Z113 Encounter for screening for infections with a predominantly sexual mode of transmission: Secondary | ICD-10-CM | POA: Diagnosis not present

## 2020-07-16 DIAGNOSIS — Z0000 Encounter for general adult medical examination without abnormal findings: Secondary | ICD-10-CM

## 2020-07-16 DIAGNOSIS — Z131 Encounter for screening for diabetes mellitus: Secondary | ICD-10-CM | POA: Diagnosis not present

## 2020-07-16 DIAGNOSIS — N3090 Cystitis, unspecified without hematuria: Secondary | ICD-10-CM

## 2020-07-16 DIAGNOSIS — Z13220 Encounter for screening for lipoid disorders: Secondary | ICD-10-CM | POA: Diagnosis not present

## 2020-07-16 DIAGNOSIS — Z01419 Encounter for gynecological examination (general) (routine) without abnormal findings: Secondary | ICD-10-CM | POA: Diagnosis not present

## 2020-07-16 DIAGNOSIS — N949 Unspecified condition associated with female genital organs and menstrual cycle: Secondary | ICD-10-CM | POA: Diagnosis not present

## 2020-07-16 DIAGNOSIS — N951 Menopausal and female climacteric states: Secondary | ICD-10-CM

## 2020-07-16 DIAGNOSIS — Z3041 Encounter for surveillance of contraceptive pills: Secondary | ICD-10-CM

## 2020-07-16 DIAGNOSIS — R350 Frequency of micturition: Secondary | ICD-10-CM

## 2020-07-16 DIAGNOSIS — N898 Other specified noninflammatory disorders of vagina: Secondary | ICD-10-CM

## 2020-07-16 LAB — WET PREP FOR TRICH, YEAST, CLUE

## 2020-07-16 MED ORDER — SULFAMETHOXAZOLE-TRIMETHOPRIM 800-160 MG PO TABS: 1.0000 | ORAL_TABLET | Freq: Two times a day (BID) | ORAL | 0 refills | Status: DC

## 2020-07-16 MED ORDER — LO LOESTRIN FE 1 MG-10 MCG / 10 MCG PO TABS
1.0000 | ORAL_TABLET | Freq: Every day | ORAL | 4 refills | Status: DC
Start: 2020-07-16 — End: 2020-09-17

## 2020-07-16 NOTE — Patient Instructions (Signed)
Health Maintenance, Female Adopting a healthy lifestyle and getting preventive care are important in promoting health and wellness. Ask your health care provider about:  The right schedule for you to have regular tests and exams.  Things you can do on your own to prevent diseases and keep yourself healthy. What should I know about diet, weight, and exercise? Eat a healthy diet  Eat a diet that includes plenty of vegetables, fruits, low-fat dairy products, and lean protein.  Do not eat a lot of foods that are high in solid fats, added sugars, or sodium.   Maintain a healthy weight Body mass index (BMI) is used to identify weight problems. It estimates body fat based on height and weight. Your health care provider can help determine your BMI and help you achieve or maintain a healthy weight. Get regular exercise Get regular exercise. This is one of the most important things you can do for your health. Most adults should:  Exercise for at least 150 minutes each week. The exercise should increase your heart rate and make you sweat (moderate-intensity exercise).  Do strengthening exercises at least twice a week. This is in addition to the moderate-intensity exercise.  Spend less time sitting. Even light physical activity can be beneficial. Watch cholesterol and blood lipids Have your blood tested for lipids and cholesterol at 54 years of age, then have this test every 5 years. Have your cholesterol levels checked more often if:  Your lipid or cholesterol levels are high.  You are older than 54 years of age.  You are at high risk for heart disease. What should I know about cancer screening? Depending on your health history and family history, you may need to have cancer screening at various ages. This may include screening for:  Breast cancer.  Cervical cancer.  Colorectal cancer.  Skin cancer.  Lung cancer. What should I know about heart disease, diabetes, and high blood  pressure? Blood pressure and heart disease  High blood pressure causes heart disease and increases the risk of stroke. This is more likely to develop in people who have high blood pressure readings, are of African descent, or are overweight.  Have your blood pressure checked: ? Every 3-5 years if you are 18-39 years of age. ? Every year if you are 40 years old or older. Diabetes Have regular diabetes screenings. This checks your fasting blood sugar level. Have the screening done:  Once every three years after age 40 if you are at a normal weight and have a low risk for diabetes.  More often and at a younger age if you are overweight or have a high risk for diabetes. What should I know about preventing infection? Hepatitis B If you have a higher risk for hepatitis B, you should be screened for this virus. Talk with your health care provider to find out if you are at risk for hepatitis B infection. Hepatitis C Testing is recommended for:  Everyone born from 1945 through 1965.  Anyone with known risk factors for hepatitis C. Sexually transmitted infections (STIs)  Get screened for STIs, including gonorrhea and chlamydia, if: ? You are sexually active and are younger than 54 years of age. ? You are older than 54 years of age and your health care provider tells you that you are at risk for this type of infection. ? Your sexual activity has changed since you were last screened, and you are at increased risk for chlamydia or gonorrhea. Ask your health care provider   if you are at risk.  Ask your health care provider about whether you are at high risk for HIV. Your health care provider may recommend a prescription medicine to help prevent HIV infection. If you choose to take medicine to prevent HIV, you should first get tested for HIV. You should then be tested every 3 months for as long as you are taking the medicine. Pregnancy  If you are about to stop having your period (premenopausal) and  you may become pregnant, seek counseling before you get pregnant.  Take 400 to 800 micrograms (mcg) of folic acid every day if you become pregnant.  Ask for birth control (contraception) if you want to prevent pregnancy. Osteoporosis and menopause Osteoporosis is a disease in which the bones lose minerals and strength with aging. This can result in bone fractures. If you are 65 years old or older, or if you are at risk for osteoporosis and fractures, ask your health care provider if you should:  Be screened for bone loss.  Take a calcium or vitamin D supplement to lower your risk of fractures.  Be given hormone replacement therapy (HRT) to treat symptoms of menopause. Follow these instructions at home: Lifestyle  Do not use any products that contain nicotine or tobacco, such as cigarettes, e-cigarettes, and chewing tobacco. If you need help quitting, ask your health care provider.  Do not use street drugs.  Do not share needles.  Ask your health care provider for help if you need support or information about quitting drugs. Alcohol use  Do not drink alcohol if: ? Your health care provider tells you not to drink. ? You are pregnant, may be pregnant, or are planning to become pregnant.  If you drink alcohol: ? Limit how much you use to 0-1 drink a day. ? Limit intake if you are breastfeeding.  Be aware of how much alcohol is in your drink. In the [place] one drink equals one 12 oz bottle of beer (355 mL), one 5 oz glass of wine (148 mL), or one 1 oz glass of hard liquor (44 mL). General instructions  Schedule regular health, dental, and eye exams.  Stay current with your vaccines.  Tell your health care provider if: ? You often feel depressed. ? You have ever been abused or do not feel safe at home. Summary  Adopting a healthy lifestyle and getting preventive care are important in promoting health and wellness.  Follow your health care provider's instructions about healthy  diet, exercising, and getting tested or screened for diseases.  Follow your health care provider's instructions on monitoring your cholesterol and blood pressure. This information is not intended to replace advice given to you by your health care provider. Make sure you discuss any questions you have with your health care provider. Document Revised: 04/07/2018 Document Reviewed: 04/07/2018 Elsevier Patient Education  2021 [employer]  

## 2020-07-17 LAB — COMPREHENSIVE METABOLIC PANEL
AG Ratio: 1.6 (calc) (ref 1.0–2.5)
ALT: 7 U/L (ref 6–29)
AST: 18 U/L (ref 10–35)
Albumin: 4.4 g/dL (ref 3.6–5.1)
Alkaline phosphatase (APISO): 66 U/L (ref 37–153)
BUN: 23 mg/dL (ref 7–25)
CO2: 24 mmol/L (ref 20–32)
Calcium: 9.8 mg/dL (ref 8.6–10.4)
Chloride: 107 mmol/L (ref 98–110)
Creat: 0.92 mg/dL (ref 0.50–1.05)
Globulin: 2.7 g/dL (calc) (ref 1.9–3.7)
Glucose, Bld: 104 mg/dL — ABNORMAL HIGH (ref 65–99)
Potassium: 4.3 mmol/L (ref 3.5–5.3)
Sodium: 141 mmol/L (ref 135–146)
Total Bilirubin: 0.4 mg/dL (ref 0.2–1.2)
Total Protein: 7.1 g/dL (ref 6.1–8.1)

## 2020-07-17 LAB — C. TRACHOMATIS/N. GONORRHOEAE RNA
C. trachomatis RNA, TMA: NOT DETECTED
N. gonorrhoeae RNA, TMA: NOT DETECTED

## 2020-07-17 LAB — LIPID PANEL
Cholesterol: 227 mg/dL — ABNORMAL HIGH (ref <200)
HDL: 67 mg/dL (ref >=50)
LDL Cholesterol (Calc): 136 mg/dL (calc) — ABNORMAL HIGH
Non-HDL Cholesterol (Calc): 160 mg/dL (calc) — ABNORMAL HIGH (ref <130)
Total CHOL/HDL Ratio: 3.4 (calc) (ref <5.0)
Triglycerides: 121 mg/dL (ref <150)

## 2020-07-17 LAB — CBC
HCT: 38.1 % (ref 35.0–45.0)
Hemoglobin: 12.1 g/dL (ref 11.7–15.5)
MCH: 29.7 pg (ref 27.0–33.0)
MCHC: 31.8 g/dL — ABNORMAL LOW (ref 32.0–36.0)
MCV: 93.6 fL (ref 80.0–100.0)
MPV: 10 fL (ref 7.5–12.5)
Platelets: 281 10*3/uL (ref 140–400)
RBC: 4.07 10*6/uL (ref 3.80–5.10)
RDW: 12.8 % (ref 11.0–15.0)
WBC: 4.9 10*3/uL (ref 3.8–10.8)

## 2020-07-18 LAB — URINALYSIS, COMPLETE W/RFL CULTURE
Bilirubin Urine: NEGATIVE
Glucose, UA: NEGATIVE
Hgb urine dipstick: NEGATIVE
Hyaline Cast: NONE SEEN /LPF
Ketones, ur: NEGATIVE
Nitrites, Initial: POSITIVE — AB
Protein, ur: NEGATIVE
RBC / HPF: NONE SEEN /HPF (ref 0–2)
Specific Gravity, Urine: 1.025 (ref 1.001–1.03)
pH: 5.5 (ref 5.0–8.0)

## 2020-07-18 LAB — URINE CULTURE
MICRO NUMBER:: 11670978
SPECIMEN QUALITY:: ADEQUATE

## 2020-07-18 LAB — CULTURE INDICATED

## 2020-07-19 ENCOUNTER — Other Ambulatory Visit: Payer: Self-pay | Admitting: Nurse Practitioner

## 2020-07-19 ENCOUNTER — Telehealth: Payer: Self-pay | Admitting: *Deleted

## 2020-07-19 MED ORDER — AMOXICILLIN 500 MG PO CAPS: ORAL_CAPSULE | ORAL | 0 refills | Status: DC

## 2020-07-19 NOTE — Progress Notes (Signed)
Pt still symptomatic for cystitis. Was given Rx for Bactrim DOS, culture showed GBS. Will send Rx for Amoxicillin.

## 2020-07-19 NOTE — Telephone Encounter (Signed)
Thanks, I sent Rx

## 2020-07-19 NOTE — Telephone Encounter (Signed)
Patient received her my chart message regarding needing a different antibiotic,patient reports she is still having symptoms. She would like another Rx sent [facility] in [place]

## 2020-07-31 DIAGNOSIS — H5213 Myopia, bilateral: Secondary | ICD-10-CM | POA: Diagnosis not present

## 2020-08-09 ENCOUNTER — Encounter: Payer: Self-pay | Admitting: Obstetrics & Gynecology

## 2020-08-09 ENCOUNTER — Ambulatory Visit (INDEPENDENT_AMBULATORY_CARE_PROVIDER_SITE_OTHER): Payer: BLUE CROSS/BLUE SHIELD

## 2020-08-09 ENCOUNTER — Other Ambulatory Visit: Payer: Self-pay

## 2020-08-09 ENCOUNTER — Ambulatory Visit: Payer: BLUE CROSS/BLUE SHIELD | Admitting: Obstetrics & Gynecology

## 2020-08-09 VITALS — BP 130/80

## 2020-08-09 DIAGNOSIS — R102 Pelvic and perineal pain: Secondary | ICD-10-CM | POA: Diagnosis not present

## 2020-08-09 DIAGNOSIS — N949 Unspecified condition associated with female genital organs and menstrual cycle: Secondary | ICD-10-CM

## 2020-08-09 DIAGNOSIS — R350 Frequency of micturition: Secondary | ICD-10-CM | POA: Diagnosis not present

## 2020-08-09 DIAGNOSIS — R339 Retention of urine, unspecified: Secondary | ICD-10-CM

## 2020-08-09 MED ORDER — CIPROFLOXACIN HCL 500 MG PO TABS: 500.0000 mg | ORAL_TABLET | Freq: Two times a day (BID) | ORAL | 0 refills | Status: AC

## 2020-08-09 NOTE — Progress Notes (Signed)
    Cheryl Johnson Sep 09, 1965 [id number]        55 y.o.  [id number]   RP: Pelvic tenderness x a few weeks  HPI: Had a pelvic exam with Clarita Crane NP on 07/16/2020, the uterus felt tender.  Treated for an Acute Cystitis with Bactrim DS at that time, urine culture showed strep agalactiae.  Continues to have mild discomfort with urgency and frequency.  No blood in urine.  No fever.  Rare light periods on Micronor.    OB History  Gravida Para Term Preterm AB Living  3 1 1   2 1   SAB IAB Ectopic Multiple Live Births               # Outcome Date GA Lbr Len/2nd Weight Sex Delivery Anes PTL Lv  3 Term           2 AB           1 AB             Past medical history,surgical history, problem list, medications, allergies, family history and social history were all reviewed and documented in the EPIC chart.   Directed ROS with pertinent positives and negatives documented in the history of present illness/assessment and plan.  Exam:  Vitals:   08/09/20 0918  BP: 130/80   General appearance:  Normal  Pelvic 08/11/20 today: T/V images.  Anteverted uterus normal in size and shape with no myometrial mass.  The uterus is measured at 7.12 x 4.06 x 3.08 cm.  Thin symmetrical endometrial lining with no mass or thickening seen, measured at 2.96 mm.  Right ovary seen and normal in size and appearance.  Left ovary appears normal but is more difficult to see due to over distended bladder.  Bladder remains over distended post void with small particulate echoes seen.  No adnexal mass.  No free fluid in the posterior cul-de-sac.  U/A: Yellow clear, protein negative, nitrites positive, white blood cells 10-20, red blood cells negative, bacteria moderate.  Urine culture pending.   Assessment/Plan:  55 y.o. G3P1021   1. Female pelvic pain Pelvic ultrasound findings reviewed with patient thoroughly.  Uterus and ovaries are normal.  The bladder remains over distended after voiding.  2. Frequency of  urination Per symptoms and urine analysis, patient probably has an acute cystitis.  Patient was treated with Bactrim for Strep. Agalactiae at the end of March 2022.  Decision to treat with ciprofloxacin 500 mg twice a day for 7 days.  Pending urine culture. - Urinalysis,Complete w/RFL Culture  3. Incomplete emptying of bladder Incomplete bladder emptying documented by ultrasound.  Patient had a surgery for ureteral reflux as a child.  Decision to refer to urology for further investigation and management.  Other orders - Urine Culture - REFLEXIVE URINE CULTURE - ciprofloxacin (CIPRO) 500 MG tablet; Take 1 tablet (500 mg total) by mouth 2 (two) times daily for 7 days.  April 2022 MD, 9:32 AM 08/09/2020

## 2020-08-11 LAB — URINALYSIS, COMPLETE W/RFL CULTURE
Bilirubin Urine: NEGATIVE
Glucose, UA: NEGATIVE
Hgb urine dipstick: NEGATIVE
Hyaline Cast: NONE SEEN /LPF
Ketones, ur: NEGATIVE
Nitrites, Initial: POSITIVE — AB
Protein, ur: NEGATIVE
RBC / HPF: NONE SEEN /HPF (ref 0–2)
Specific Gravity, Urine: 1.015 (ref 1.001–1.03)
pH: 7 (ref 5.0–8.0)

## 2020-08-11 LAB — URINE CULTURE
MICRO NUMBER:: 11770800
SPECIMEN QUALITY:: ADEQUATE

## 2020-08-11 LAB — CULTURE INDICATED

## 2020-08-15 ENCOUNTER — Telehealth: Payer: Self-pay | Admitting: *Deleted

## 2020-08-15 DIAGNOSIS — N200 Calculus of kidney: Secondary | ICD-10-CM | POA: Diagnosis not present

## 2020-08-15 DIAGNOSIS — N3289 Other specified disorders of bladder: Secondary | ICD-10-CM | POA: Diagnosis not present

## 2020-08-15 DIAGNOSIS — N312 Flaccid neuropathic bladder, not elsewhere classified: Secondary | ICD-10-CM | POA: Diagnosis not present

## 2020-08-15 DIAGNOSIS — K5730 Diverticulosis of large intestine without perforation or abscess without bleeding: Secondary | ICD-10-CM | POA: Diagnosis not present

## 2020-08-15 DIAGNOSIS — M545 Low back pain, unspecified: Secondary | ICD-10-CM | POA: Diagnosis not present

## 2020-08-15 DIAGNOSIS — K449 Diaphragmatic hernia without obstruction or gangrene: Secondary | ICD-10-CM | POA: Diagnosis not present

## 2020-08-15 DIAGNOSIS — R1031 Right lower quadrant pain: Secondary | ICD-10-CM | POA: Diagnosis not present

## 2020-08-15 DIAGNOSIS — R1032 Left lower quadrant pain: Secondary | ICD-10-CM | POA: Diagnosis not present

## 2020-08-15 NOTE — Telephone Encounter (Addendum)
Office notes faxed to [phone] at [facility] they will call to schedule. Patient aware she can call to schedule as well.

## 2020-08-15 NOTE — Telephone Encounter (Signed)
-----   Message from Genia Del, MD sent at 08/09/2020 10:29 AM EDT ----- Regarding: Refer to Urology Incomplete bladder emptying/Detrusor muscle dysfunction?  Cystitis.  H/O surgery for ureteral reflux as a child.

## 2020-08-16 DIAGNOSIS — N312 Flaccid neuropathic bladder, not elsewhere classified: Secondary | ICD-10-CM | POA: Diagnosis not present

## 2020-08-21 NOTE — Telephone Encounter (Signed)
Patient scheduled on 10/01/20 @ 8:15am

## 2020-08-22 DIAGNOSIS — N319 Neuromuscular dysfunction of bladder, unspecified: Secondary | ICD-10-CM | POA: Diagnosis not present

## 2020-09-11 DIAGNOSIS — N319 Neuromuscular dysfunction of bladder, unspecified: Secondary | ICD-10-CM | POA: Diagnosis not present

## 2020-09-17 ENCOUNTER — Other Ambulatory Visit: Payer: Self-pay | Admitting: Nurse Practitioner

## 2020-09-17 ENCOUNTER — Encounter: Payer: Self-pay | Admitting: Nurse Practitioner

## 2020-09-17 DIAGNOSIS — Z3041 Encounter for surveillance of contraceptive pills: Secondary | ICD-10-CM

## 2020-09-17 MED ORDER — NORETHINDRONE 0.35 MG PO TABS
1.0000 | ORAL_TABLET | Freq: Every day | ORAL | 3 refills | Status: DC
Start: 2020-09-17 — End: 2021-08-20

## 2020-10-01 DIAGNOSIS — R300 Dysuria: Secondary | ICD-10-CM | POA: Diagnosis not present

## 2020-10-01 DIAGNOSIS — B964 Proteus (mirabilis) (morganii) as the cause of diseases classified elsewhere: Secondary | ICD-10-CM | POA: Diagnosis not present

## 2020-10-01 DIAGNOSIS — N312 Flaccid neuropathic bladder, not elsewhere classified: Secondary | ICD-10-CM | POA: Diagnosis not present

## 2020-10-01 DIAGNOSIS — N390 Urinary tract infection, site not specified: Secondary | ICD-10-CM | POA: Diagnosis not present

## 2020-10-02 DIAGNOSIS — N319 Neuromuscular dysfunction of bladder, unspecified: Secondary | ICD-10-CM | POA: Diagnosis not present

## 2020-10-26 DIAGNOSIS — N319 Neuromuscular dysfunction of bladder, unspecified: Secondary | ICD-10-CM | POA: Diagnosis not present

## 2020-12-11 DIAGNOSIS — N319 Neuromuscular dysfunction of bladder, unspecified: Secondary | ICD-10-CM | POA: Diagnosis not present

## 2021-01-15 DIAGNOSIS — N319 Neuromuscular dysfunction of bladder, unspecified: Secondary | ICD-10-CM | POA: Diagnosis not present

## 2021-02-01 DIAGNOSIS — N312 Flaccid neuropathic bladder, not elsewhere classified: Secondary | ICD-10-CM | POA: Diagnosis not present

## 2021-02-11 DIAGNOSIS — N319 Neuromuscular dysfunction of bladder, unspecified: Secondary | ICD-10-CM | POA: Diagnosis not present

## 2021-03-05 DIAGNOSIS — Z1211 Encounter for screening for malignant neoplasm of colon: Secondary | ICD-10-CM | POA: Diagnosis not present

## 2021-03-05 DIAGNOSIS — K649 Unspecified hemorrhoids: Secondary | ICD-10-CM | POA: Diagnosis not present

## 2021-03-05 DIAGNOSIS — K5730 Diverticulosis of large intestine without perforation or abscess without bleeding: Secondary | ICD-10-CM | POA: Diagnosis not present

## 2021-03-13 DIAGNOSIS — N319 Neuromuscular dysfunction of bladder, unspecified: Secondary | ICD-10-CM | POA: Diagnosis not present

## 2021-03-20 DIAGNOSIS — M25521 Pain in right elbow: Secondary | ICD-10-CM | POA: Diagnosis not present

## 2021-03-25 DIAGNOSIS — M25521 Pain in right elbow: Secondary | ICD-10-CM | POA: Diagnosis not present

## 2021-04-05 DIAGNOSIS — N319 Neuromuscular dysfunction of bladder, unspecified: Secondary | ICD-10-CM | POA: Diagnosis not present

## 2021-04-06 DIAGNOSIS — J101 Influenza due to other identified influenza virus with other respiratory manifestations: Secondary | ICD-10-CM | POA: Diagnosis not present

## 2021-07-02 DIAGNOSIS — N319 Neuromuscular dysfunction of bladder, unspecified: Secondary | ICD-10-CM | POA: Diagnosis not present

## 2021-07-19 DIAGNOSIS — Z1231 Encounter for screening mammogram for malignant neoplasm of breast: Secondary | ICD-10-CM | POA: Diagnosis not present

## 2021-07-22 ENCOUNTER — Encounter: Payer: Self-pay | Admitting: Obstetrics & Gynecology

## 2021-08-01 DIAGNOSIS — N319 Neuromuscular dysfunction of bladder, unspecified: Secondary | ICD-10-CM | POA: Diagnosis not present

## 2021-08-09 ENCOUNTER — Ambulatory Visit (INDEPENDENT_AMBULATORY_CARE_PROVIDER_SITE_OTHER): Payer: BC Managed Care – PPO | Admitting: Obstetrics & Gynecology

## 2021-08-09 ENCOUNTER — Encounter: Payer: Self-pay | Admitting: Obstetrics & Gynecology

## 2021-08-09 ENCOUNTER — Other Ambulatory Visit (HOSPITAL_COMMUNITY)
Admission: RE | Admit: 2021-08-09 | Discharge: 2021-08-09 | Disposition: A | Discharge status: Home / Self Care | Payer: BC Managed Care – PPO | Source: Ambulatory Visit | Attending: Obstetrics & Gynecology | Admitting: Obstetrics & Gynecology

## 2021-08-09 VITALS — BP 110/74 | HR 72 | Resp 16 | Ht 63.75 in | Wt 176.0 lb

## 2021-08-09 DIAGNOSIS — E669 Obesity, unspecified: Secondary | ICD-10-CM

## 2021-08-09 DIAGNOSIS — Z01419 Encounter for gynecological examination (general) (routine) without abnormal findings: Secondary | ICD-10-CM | POA: Diagnosis not present

## 2021-08-09 DIAGNOSIS — N951 Menopausal and female climacteric states: Secondary | ICD-10-CM | POA: Diagnosis not present

## 2021-08-09 NOTE — Progress Notes (Signed)
? ? ?Cheryl Johnson 1965-10-05 [id number] ? ? ?History:    56 y.o. G3P1A2L1 ? ?RP:  Established patient presenting for annual gyn exam  ?  ?HPI: Menopause x 1 year with hot flushes/night sweats.  No PMB. Muscle aches.  Difficulty loosing weight in spite of low carb diet and fitness activities.  No pelvic pain.  No pain with IC.  Pap Neg 05/2016.  Pap reflex today.  Breasts normal.  Mammo Neg 06/2021.  Colono 02/2021.   ? ?Past medical history,surgical history, family history and social history were all reviewed and documented in the EPIC chart. ? ?Gynecologic History ?No LMP recorded. (Menstrual status: Perimenopausal). ? ?Obstetric History ?OB History  ?Gravida Para Term Preterm AB Living  ?3 1 1   2 1   ?SAB IAB Ectopic Multiple Live Births  ?2          ?  ?# Outcome Date GA Lbr Len/2nd Weight Sex Delivery Anes PTL Lv  ?3 Term           ?2 SAB           ?1 SAB           ? ? ? ?ROS: A ROS was performed and pertinent positives and negatives are included in the history. ? GENERAL: No fevers or chills. HEENT: No change in vision, no earache, sore throat or sinus congestion. NECK: No pain or stiffness. CARDIOVASCULAR: No chest pain or pressure. No palpitations. PULMONARY: No shortness of breath, cough or wheeze. GASTROINTESTINAL: No abdominal pain, nausea, vomiting or diarrhea, melena or bright red blood per rectum. GENITOURINARY: No urinary frequency, urgency, hesitancy or dysuria. MUSCULOSKELETAL: No joint or muscle pain, no back pain, no recent trauma. DERMATOLOGIC: No rash, no itching, no lesions. ENDOCRINE: No polyuria, polydipsia, no heat or cold intolerance. No recent change in weight. HEMATOLOGICAL: No anemia or easy bruising or bleeding. NEUROLOGIC: No headache, seizures, numbness, tingling or weakness. PSYCHIATRIC: No depression, no loss of interest in normal activity or change in sleep pattern.  ?  ? ?Exam: ? ? ?BP 110/74   Pulse 72   Resp 16   Ht 5' 3.75" (1.619 m)   Wt 176 lb (79.8 kg)   BMI 30.45  kg/m?  ? ?Body mass index is 30.45 kg/m?. ? ?General appearance : Well developed well nourished female. No acute distress ?HEENT: Eyes: no retinal hemorrhage or exudates,  Neck supple, trachea midline, no carotid bruits, no thyroidmegaly ?Lungs: Clear to auscultation, no rhonchi or wheezes, or rib retractions  ?Heart: Regular rate and rhythm, no murmurs or gallops ?Breast:Examined in sitting and supine position were symmetrical in appearance, no palpable masses or tenderness,  no skin retraction, no nipple inversion, no nipple discharge, no skin discoloration, no axillary or supraclavicular lymphadenopathy ?Abdomen: no palpable masses or tenderness, no rebound or guarding ?Extremities: no edema or skin discoloration or tenderness ? ?Pelvic: Vulva: Normal ?            Vagina: No gross lesions or discharge ? Cervix: No gross lesions or discharge. Pap reflex done. ? Uterus  AV, normal size, shape and consistency, non-tender and mobile ? Adnexa  Without masses or tenderness ? Anus: Normal ? ? ?Assessment/Plan:  56 y.o. female for annual exam  ? ?1. Encounter for routine gynecological examination with Papanicolaou smear of cervix ?Menopause x 1 year with hot flushes/night sweats.  No PMB. Muscle aches.  Difficulty loosing weight in spite of low carb diet and fitness activities.  No pelvic pain.  No pain with IC.  Pap Neg 05/2016.  Pap reflex today.  Breasts normal.  Mammo Neg 06/2021.  Colono 02/2021.   ?- Vitamin D (25 hydroxy) ?- TSH ?- Cytology - PAP( [facility] [facility]) ? ?2. Menopausal syndrome ?Menopause x 1 year with hot flushes/night sweats.  No PMB. Muscle aches.  Difficulty loosing weight in spite of low carb diet and fitness activities. Will try Ashwagandha. ?- Vitamin D (25 hydroxy) ? ?3. Obesity (BMI 30.0-34.9) ?Difficulty loosing weight in spite of low carb diet and fitness activities.  ?- TSH ? ?Other orders ?- ALPRAZolam (XANAX PO); Take by mouth as needed.  ? ?Genia Del MD, 10:51 AM 08/09/2021 ? ?  ?

## 2021-08-10 LAB — VITAMIN D 25 HYDROXY (VIT D DEFICIENCY, FRACTURES): Vit D, 25-Hydroxy: 49 ng/mL (ref 30–100)

## 2021-08-10 LAB — TSH: TSH: 1.84 mIU/L

## 2021-08-12 ENCOUNTER — Other Ambulatory Visit: Payer: Self-pay

## 2021-08-12 DIAGNOSIS — Z3041 Encounter for surveillance of contraceptive pills: Secondary | ICD-10-CM

## 2021-08-12 LAB — CYTOLOGY - PAP: Diagnosis: NEGATIVE

## 2021-08-14 ENCOUNTER — Encounter: Payer: Self-pay | Admitting: Obstetrics & Gynecology

## 2021-08-15 ENCOUNTER — Other Ambulatory Visit: Payer: Self-pay

## 2021-08-15 DIAGNOSIS — Z130 Encounter for screening for diseases of the blood and blood-forming organs and certain disorders involving the immune mechanism: Secondary | ICD-10-CM

## 2021-08-15 DIAGNOSIS — E669 Obesity, unspecified: Secondary | ICD-10-CM

## 2021-08-15 DIAGNOSIS — Z0000 Encounter for general adult medical examination without abnormal findings: Secondary | ICD-10-CM

## 2021-08-15 NOTE — Telephone Encounter (Signed)
Would you be willing to authorize additional blood work to be done. CBC/Lipid Panel/CMP? Pt assumed they were all done @ [facility] on 08/09/21 since she has always had them done by [place]. Please advise.  ?

## 2021-08-16 ENCOUNTER — Other Ambulatory Visit: Payer: BC Managed Care – PPO

## 2021-08-16 DIAGNOSIS — Z130 Encounter for screening for diseases of the blood and blood-forming organs and certain disorders involving the immune mechanism: Secondary | ICD-10-CM

## 2021-08-16 DIAGNOSIS — Z0000 Encounter for general adult medical examination without abnormal findings: Secondary | ICD-10-CM

## 2021-08-16 DIAGNOSIS — E669 Obesity, unspecified: Secondary | ICD-10-CM

## 2021-08-20 NOTE — Telephone Encounter (Signed)
FYI. Pt notified and voiced understanding. Med List updated. Pt reports frustration the amt of time that it is taking for her to get feedback on her results. Pt reported that she was taking omeprazole daily for about 6-8 months but has now stopped due to some research on Internet and how it may consist with the way her lab results look. Voiced thanks for patience and advised we should be getting with her soon.  ?

## 2021-08-20 NOTE — Telephone Encounter (Signed)
Per ML: ?"Please update medication list.  I will review her labs and give recommendations accordingly.  As a first advice for dark urine, increase hydration if not having enough water.  Then, medication, drinks/foods or blood in urine could be causes.  Schedule with Fam MD or me for a U/A if persists." ? ?LVMTCB.  ?

## 2021-08-21 ENCOUNTER — Other Ambulatory Visit: Payer: Self-pay

## 2021-08-21 DIAGNOSIS — R7989 Other specified abnormal findings of blood chemistry: Secondary | ICD-10-CM

## 2021-08-22 ENCOUNTER — Other Ambulatory Visit: Payer: Self-pay

## 2021-08-22 DIAGNOSIS — D508 Other iron deficiency anemias: Secondary | ICD-10-CM

## 2021-08-23 LAB — COMPREHENSIVE METABOLIC PANEL
AG Ratio: 1.2 (calc) (ref 1.0–2.5)
ALT: 120 U/L — ABNORMAL HIGH (ref 6–29)
AST: 94 U/L — ABNORMAL HIGH (ref 10–35)
Albumin: 3.6 g/dL (ref 3.6–5.1)
Alkaline phosphatase (APISO): 83 U/L (ref 37–153)
BUN: 18 mg/dL (ref 7–25)
CO2: 28 mmol/L (ref 20–32)
Calcium: 9 mg/dL (ref 8.6–10.4)
Chloride: 110 mmol/L (ref 98–110)
Creat: 0.98 mg/dL (ref 0.50–1.03)
Globulin: 3.1 g/dL (calc) (ref 1.9–3.7)
Glucose, Bld: 112 mg/dL — ABNORMAL HIGH (ref 65–99)
Potassium: 5.2 mmol/L (ref 3.5–5.3)
Sodium: 143 mmol/L (ref 135–146)
Total Bilirubin: 0.4 mg/dL (ref 0.2–1.2)
Total Protein: 6.7 g/dL (ref 6.1–8.1)

## 2021-08-23 LAB — LIPID PANEL
Cholesterol: 147 mg/dL (ref <200)
HDL: 32 mg/dL — ABNORMAL LOW (ref >=50)
LDL Cholesterol (Calc): 93 mg/dL (calc)
Non-HDL Cholesterol (Calc): 115 mg/dL (calc) (ref <130)
Total CHOL/HDL Ratio: 4.6 (calc) (ref <5.0)
Triglycerides: 120 mg/dL (ref <150)

## 2021-08-23 LAB — CBC
HCT: 34.8 % — ABNORMAL LOW (ref 35.0–45.0)
Hemoglobin: 10.9 g/dL — ABNORMAL LOW (ref 11.7–15.5)
MCH: 27.6 pg (ref 27.0–33.0)
MCHC: 31.3 g/dL — ABNORMAL LOW (ref 32.0–36.0)
MCV: 88.1 fL (ref 80.0–100.0)
MPV: 9.2 fL (ref 7.5–12.5)
Platelets: 304 10*3/uL (ref 140–400)
RBC: 3.95 10*6/uL (ref 3.80–5.10)
RDW: 14 % (ref 11.0–15.0)
WBC: 7.8 10*3/uL (ref 3.8–10.8)

## 2021-08-23 LAB — HEMOGLOBIN A1C
Hgb A1c MFr Bld: 6.3 % of total Hgb — ABNORMAL HIGH (ref <5.7)
Mean Plasma Glucose: 134 mg/dL
eAG (mmol/L): 7.4 mmol/L

## 2021-08-23 LAB — IRON,TIBC AND FERRITIN PANEL
%SAT: 14 % (calc) — ABNORMAL LOW (ref 16–45)
Ferritin: 124 ng/mL (ref 16–232)
Iron: 57 ug/dL (ref 45–160)
TIBC: 421 mcg/dL (calc) (ref 250–450)

## 2021-08-27 DIAGNOSIS — N319 Neuromuscular dysfunction of bladder, unspecified: Secondary | ICD-10-CM | POA: Diagnosis not present

## 2021-09-09 ENCOUNTER — Other Ambulatory Visit: Payer: BC Managed Care – PPO

## 2021-09-09 DIAGNOSIS — D508 Other iron deficiency anemias: Secondary | ICD-10-CM

## 2021-09-09 DIAGNOSIS — R7989 Other specified abnormal findings of blood chemistry: Secondary | ICD-10-CM

## 2021-09-09 DIAGNOSIS — R7401 Elevation of levels of liver transaminase levels: Secondary | ICD-10-CM | POA: Diagnosis not present

## 2021-09-09 DIAGNOSIS — R7303 Prediabetes: Secondary | ICD-10-CM | POA: Diagnosis not present

## 2021-09-10 LAB — ALT: ALT: 17 U/L (ref 6–29)

## 2021-09-10 LAB — VITAMIN B12: Vitamin B-12: 541 pg/mL (ref 200–1100)

## 2021-09-10 LAB — AST: AST: 29 U/L (ref 10–35)

## 2021-09-10 LAB — EXTRA SPECIMEN

## 2021-09-19 NOTE — Telephone Encounter (Signed)
It appears VIt B12, ALT and AST were normal on repeat. HGB A1c was added on and was elevated at 6.3   08-20-21 lab result note "Glucose high at 112.  Add HBA1C.   CMP AST 94, Alt 120, both mildly increased.  Repeat AST, ALT in 1 month. FLP  HDL too low.  Needs to increase Fish oil, Fish... Good Cholesterol.  Total Chol/HDL is higher than before at 4.6 because of her low HDL. CBC Hb low at 10.9.  Postmenopause, no vaginal bleeding.  Intestinal bleeding?  Needs to see her Fam MD for investigation/Gastro.  Add Vit B12 and Iron panel to work up."

## 2021-09-26 DIAGNOSIS — N319 Neuromuscular dysfunction of bladder, unspecified: Secondary | ICD-10-CM | POA: Diagnosis not present

## 2021-10-28 DIAGNOSIS — N319 Neuromuscular dysfunction of bladder, unspecified: Secondary | ICD-10-CM | POA: Diagnosis not present

## 2021-11-26 DIAGNOSIS — N319 Neuromuscular dysfunction of bladder, unspecified: Secondary | ICD-10-CM | POA: Diagnosis not present

## 2022-01-03 DIAGNOSIS — N319 Neuromuscular dysfunction of bladder, unspecified: Secondary | ICD-10-CM | POA: Diagnosis not present

## 2022-01-27 DIAGNOSIS — N319 Neuromuscular dysfunction of bladder, unspecified: Secondary | ICD-10-CM | POA: Diagnosis not present

## 2022-02-23 DIAGNOSIS — J069 Acute upper respiratory infection, unspecified: Secondary | ICD-10-CM | POA: Diagnosis not present

## 2022-02-23 DIAGNOSIS — J209 Acute bronchitis, unspecified: Secondary | ICD-10-CM | POA: Diagnosis not present

## 2022-02-23 DIAGNOSIS — R051 Acute cough: Secondary | ICD-10-CM | POA: Diagnosis not present

## 2022-02-28 DIAGNOSIS — N319 Neuromuscular dysfunction of bladder, unspecified: Secondary | ICD-10-CM | POA: Diagnosis not present

## 2022-03-08 DIAGNOSIS — J209 Acute bronchitis, unspecified: Secondary | ICD-10-CM | POA: Diagnosis not present

## 2022-03-18 DIAGNOSIS — Z6830 Body mass index (BMI) 30.0-30.9, adult: Secondary | ICD-10-CM | POA: Diagnosis not present

## 2022-03-18 DIAGNOSIS — R0982 Postnasal drip: Secondary | ICD-10-CM | POA: Diagnosis not present

## 2022-03-18 DIAGNOSIS — M542 Cervicalgia: Secondary | ICD-10-CM | POA: Diagnosis not present

## 2022-03-18 DIAGNOSIS — R42 Dizziness and giddiness: Secondary | ICD-10-CM | POA: Diagnosis not present

## 2022-03-24 DIAGNOSIS — R7989 Other specified abnormal findings of blood chemistry: Secondary | ICD-10-CM | POA: Diagnosis not present

## 2022-03-24 DIAGNOSIS — Z13220 Encounter for screening for lipoid disorders: Secondary | ICD-10-CM | POA: Diagnosis not present

## 2022-03-24 DIAGNOSIS — M542 Cervicalgia: Secondary | ICD-10-CM | POA: Diagnosis not present

## 2022-03-31 DIAGNOSIS — R339 Retention of urine, unspecified: Secondary | ICD-10-CM | POA: Diagnosis not present

## 2022-04-09 DIAGNOSIS — N319 Neuromuscular dysfunction of bladder, unspecified: Secondary | ICD-10-CM | POA: Diagnosis not present

## 2022-04-22 DIAGNOSIS — J069 Acute upper respiratory infection, unspecified: Secondary | ICD-10-CM | POA: Diagnosis not present

## 2022-04-22 DIAGNOSIS — R051 Acute cough: Secondary | ICD-10-CM | POA: Diagnosis not present

## 2022-05-07 DIAGNOSIS — N319 Neuromuscular dysfunction of bladder, unspecified: Secondary | ICD-10-CM | POA: Diagnosis not present

## 2022-06-06 DIAGNOSIS — N319 Neuromuscular dysfunction of bladder, unspecified: Secondary | ICD-10-CM | POA: Diagnosis not present

## 2022-07-08 DIAGNOSIS — N319 Neuromuscular dysfunction of bladder, unspecified: Secondary | ICD-10-CM | POA: Diagnosis not present

## 2022-08-01 DIAGNOSIS — Z1231 Encounter for screening mammogram for malignant neoplasm of breast: Secondary | ICD-10-CM | POA: Diagnosis not present

## 2022-08-05 DIAGNOSIS — N319 Neuromuscular dysfunction of bladder, unspecified: Secondary | ICD-10-CM | POA: Diagnosis not present

## 2022-08-11 DIAGNOSIS — E559 Vitamin D deficiency, unspecified: Secondary | ICD-10-CM | POA: Diagnosis not present

## 2022-08-11 DIAGNOSIS — N760 Acute vaginitis: Secondary | ICD-10-CM | POA: Diagnosis not present

## 2022-08-11 DIAGNOSIS — N390 Urinary tract infection, site not specified: Secondary | ICD-10-CM | POA: Diagnosis not present

## 2022-08-11 DIAGNOSIS — Z6829 Body mass index (BMI) 29.0-29.9, adult: Secondary | ICD-10-CM | POA: Diagnosis not present

## 2022-08-11 DIAGNOSIS — Z131 Encounter for screening for diabetes mellitus: Secondary | ICD-10-CM | POA: Diagnosis not present

## 2022-08-11 DIAGNOSIS — N951 Menopausal and female climacteric states: Secondary | ICD-10-CM | POA: Diagnosis not present

## 2022-08-11 DIAGNOSIS — Z124 Encounter for screening for malignant neoplasm of cervix: Secondary | ICD-10-CM | POA: Diagnosis not present

## 2022-08-11 DIAGNOSIS — Z13220 Encounter for screening for lipoid disorders: Secondary | ICD-10-CM | POA: Diagnosis not present

## 2022-08-11 DIAGNOSIS — Z1151 Encounter for screening for human papillomavirus (HPV): Secondary | ICD-10-CM | POA: Diagnosis not present

## 2022-08-11 DIAGNOSIS — Z130 Encounter for screening for diseases of the blood and blood-forming organs and certain disorders involving the immune mechanism: Secondary | ICD-10-CM | POA: Diagnosis not present

## 2022-08-11 DIAGNOSIS — Z01419 Encounter for gynecological examination (general) (routine) without abnormal findings: Secondary | ICD-10-CM | POA: Diagnosis not present

## 2022-08-11 DIAGNOSIS — Z13228 Encounter for screening for other metabolic disorders: Secondary | ICD-10-CM | POA: Diagnosis not present

## 2022-10-17 DIAGNOSIS — N319 Neuromuscular dysfunction of bladder, unspecified: Secondary | ICD-10-CM | POA: Diagnosis not present

## 2022-11-10 DIAGNOSIS — N951 Menopausal and female climacteric states: Secondary | ICD-10-CM | POA: Diagnosis not present

## 2022-11-10 DIAGNOSIS — Z13820 Encounter for screening for osteoporosis: Secondary | ICD-10-CM | POA: Diagnosis not present

## 2022-11-14 DIAGNOSIS — N319 Neuromuscular dysfunction of bladder, unspecified: Secondary | ICD-10-CM | POA: Diagnosis not present

## 2022-12-17 DIAGNOSIS — N319 Neuromuscular dysfunction of bladder, unspecified: Secondary | ICD-10-CM | POA: Diagnosis not present

## 2023-01-06 DIAGNOSIS — N319 Neuromuscular dysfunction of bladder, unspecified: Secondary | ICD-10-CM | POA: Diagnosis not present

## 2023-02-03 DIAGNOSIS — N319 Neuromuscular dysfunction of bladder, unspecified: Secondary | ICD-10-CM | POA: Diagnosis not present

## 2023-03-02 DIAGNOSIS — N319 Neuromuscular dysfunction of bladder, unspecified: Secondary | ICD-10-CM | POA: Diagnosis not present

## 2023-03-29 DIAGNOSIS — N319 Neuromuscular dysfunction of bladder, unspecified: Secondary | ICD-10-CM | POA: Diagnosis not present

## 2023-04-30 DIAGNOSIS — N950 Postmenopausal bleeding: Secondary | ICD-10-CM | POA: Diagnosis not present

## 2023-05-01 DIAGNOSIS — R339 Retention of urine, unspecified: Secondary | ICD-10-CM | POA: Diagnosis not present

## 2023-05-01 DIAGNOSIS — N312 Flaccid neuropathic bladder, not elsewhere classified: Secondary | ICD-10-CM | POA: Diagnosis not present

## 2023-05-31 DIAGNOSIS — R339 Retention of urine, unspecified: Secondary | ICD-10-CM | POA: Diagnosis not present

## 2023-08-07 DIAGNOSIS — H43392 Other vitreous opacities, left eye: Secondary | ICD-10-CM | POA: Diagnosis not present

## 2023-08-07 DIAGNOSIS — Z1231 Encounter for screening mammogram for malignant neoplasm of breast: Secondary | ICD-10-CM | POA: Diagnosis not present

## 2023-08-11 DIAGNOSIS — R339 Retention of urine, unspecified: Secondary | ICD-10-CM | POA: Diagnosis not present

## 2023-08-17 DIAGNOSIS — Z13228 Encounter for screening for other metabolic disorders: Secondary | ICD-10-CM | POA: Diagnosis not present

## 2023-08-17 DIAGNOSIS — Z13220 Encounter for screening for lipoid disorders: Secondary | ICD-10-CM | POA: Diagnosis not present

## 2023-08-17 DIAGNOSIS — Z01419 Encounter for gynecological examination (general) (routine) without abnormal findings: Secondary | ICD-10-CM | POA: Diagnosis not present

## 2023-08-17 DIAGNOSIS — Z6829 Body mass index (BMI) 29.0-29.9, adult: Secondary | ICD-10-CM | POA: Diagnosis not present

## 2023-08-17 DIAGNOSIS — Z1329 Encounter for screening for other suspected endocrine disorder: Secondary | ICD-10-CM | POA: Diagnosis not present

## 2023-08-17 DIAGNOSIS — Z130 Encounter for screening for diseases of the blood and blood-forming organs and certain disorders involving the immune mechanism: Secondary | ICD-10-CM | POA: Diagnosis not present

## 2023-09-08 DIAGNOSIS — H43392 Other vitreous opacities, left eye: Secondary | ICD-10-CM | POA: Diagnosis not present

## 2023-09-09 DIAGNOSIS — M25562 Pain in left knee: Secondary | ICD-10-CM | POA: Diagnosis not present

## 2023-11-09 DIAGNOSIS — N950 Postmenopausal bleeding: Secondary | ICD-10-CM | POA: Diagnosis not present

## 2023-11-19 DIAGNOSIS — R339 Retention of urine, unspecified: Secondary | ICD-10-CM | POA: Diagnosis not present

## 2024-02-02 DIAGNOSIS — M25562 Pain in left knee: Secondary | ICD-10-CM | POA: Diagnosis not present

## 2024-03-04 DIAGNOSIS — M25562 Pain in left knee: Secondary | ICD-10-CM | POA: Diagnosis not present

## 2024-03-04 DIAGNOSIS — M25462 Effusion, left knee: Secondary | ICD-10-CM | POA: Diagnosis not present

## 2024-03-07 DIAGNOSIS — R339 Retention of urine, unspecified: Secondary | ICD-10-CM | POA: Diagnosis not present

## 2024-03-12 DIAGNOSIS — M25562 Pain in left knee: Secondary | ICD-10-CM | POA: Diagnosis not present

## 2024-03-23 DIAGNOSIS — M1712 Unilateral primary osteoarthritis, left knee: Secondary | ICD-10-CM | POA: Diagnosis not present

## 2024-03-23 DIAGNOSIS — M7052 Other bursitis of knee, left knee: Secondary | ICD-10-CM | POA: Diagnosis not present

## 2024-03-28 DIAGNOSIS — Z1331 Encounter for screening for depression: Secondary | ICD-10-CM | POA: Diagnosis not present

## 2024-03-28 DIAGNOSIS — R635 Abnormal weight gain: Secondary | ICD-10-CM | POA: Diagnosis not present

## 2024-03-28 DIAGNOSIS — N644 Mastodynia: Secondary | ICD-10-CM | POA: Diagnosis not present

## 2024-03-28 DIAGNOSIS — R2233 Localized swelling, mass and lump, upper limb, bilateral: Secondary | ICD-10-CM | POA: Diagnosis not present

## 2024-03-28 DIAGNOSIS — K30 Functional dyspepsia: Secondary | ICD-10-CM | POA: Diagnosis not present
